# Patient Record
Sex: Female | Born: 1950 | Race: White | Hispanic: No | Marital: Married | Smoking: Current every day smoker
Health system: Southern US, Community
[De-identification: ages and names within clinical notes are randomized; demographics above are authoritative.]

## PROBLEM LIST (undated history)

## (undated) DIAGNOSIS — IMO0001 Reserved for inherently not codable concepts without codable children: Secondary | ICD-10-CM

## (undated) DIAGNOSIS — R609 Edema, unspecified: Secondary | ICD-10-CM

## (undated) DIAGNOSIS — M1990 Unspecified osteoarthritis, unspecified site: Secondary | ICD-10-CM

## (undated) DIAGNOSIS — K219 Gastro-esophageal reflux disease without esophagitis: Secondary | ICD-10-CM

## (undated) DIAGNOSIS — L409 Psoriasis, unspecified: Secondary | ICD-10-CM

## (undated) HISTORY — PX: OTHER SURGICAL HISTORY: SHX169

## (undated) HISTORY — PX: APPENDECTOMY: SHX54

## (undated) HISTORY — PX: ABDOMINAL HYSTERECTOMY: SHX81

## (undated) HISTORY — PX: SHOULDER SURGERY: SHX246

## (undated) HISTORY — PX: KNEE SURGERY: SHX244

---

## 2001-09-16 ENCOUNTER — Other Ambulatory Visit: Admission: RE | Admit: 2001-09-16 | Discharge: 2001-09-16 | Payer: Self-pay | Admitting: Family Medicine

## 2002-10-08 ENCOUNTER — Other Ambulatory Visit: Admission: RE | Admit: 2002-10-08 | Discharge: 2002-10-08 | Payer: Self-pay | Admitting: Family Medicine

## 2003-10-14 ENCOUNTER — Other Ambulatory Visit: Admission: RE | Admit: 2003-10-14 | Discharge: 2003-10-14 | Payer: Self-pay | Admitting: Family Medicine

## 2003-12-02 ENCOUNTER — Ambulatory Visit (HOSPITAL_COMMUNITY): Admission: RE | Admit: 2003-12-02 | Discharge: 2003-12-02 | Payer: Self-pay | Admitting: Gastroenterology

## 2004-02-24 ENCOUNTER — Emergency Department (HOSPITAL_COMMUNITY): Admission: EM | Admit: 2004-02-24 | Discharge: 2004-02-24 | Payer: Self-pay | Admitting: Emergency Medicine

## 2004-05-07 ENCOUNTER — Emergency Department (HOSPITAL_COMMUNITY): Admission: EM | Admit: 2004-05-07 | Discharge: 2004-05-07 | Payer: Self-pay | Admitting: Emergency Medicine

## 2004-07-18 ENCOUNTER — Emergency Department (HOSPITAL_COMMUNITY): Admission: EM | Admit: 2004-07-18 | Discharge: 2004-07-19 | Payer: Self-pay | Admitting: Emergency Medicine

## 2004-07-19 ENCOUNTER — Emergency Department (HOSPITAL_COMMUNITY): Admission: EM | Admit: 2004-07-19 | Discharge: 2004-07-19 | Payer: Self-pay | Admitting: Emergency Medicine

## 2009-07-15 ENCOUNTER — Emergency Department (HOSPITAL_COMMUNITY): Admission: EM | Admit: 2009-07-15 | Discharge: 2009-07-15 | Payer: Self-pay | Admitting: Emergency Medicine

## 2011-03-08 ENCOUNTER — Emergency Department (HOSPITAL_COMMUNITY)
Admission: EM | Admit: 2011-03-08 | Discharge: 2011-03-09 | Disposition: A | Discharge status: Home / Self Care | Payer: Federal, State, Local not specified - PPO | Attending: Emergency Medicine | Admitting: Emergency Medicine

## 2011-03-08 DIAGNOSIS — T6391XA Toxic effect of contact with unspecified venomous animal, accidental (unintentional), initial encounter: Secondary | ICD-10-CM | POA: Insufficient documentation

## 2011-03-08 DIAGNOSIS — K219 Gastro-esophageal reflux disease without esophagitis: Secondary | ICD-10-CM | POA: Insufficient documentation

## 2011-03-08 DIAGNOSIS — T63461A Toxic effect of venom of wasps, accidental (unintentional), initial encounter: Secondary | ICD-10-CM | POA: Insufficient documentation

## 2011-05-04 NOTE — Op Note (Signed)
NAME:  Tricia Baker, Tricia Baker                       ACCOUNT NO.:  [id number]   MEDICAL RECORD NO.:  [id number]                   PATIENT TYPE:  AMB   LOCATION:  ENDO                                 FACILITY:  [facility]   PHYSICIAN:  John C. Madilyn Fireman, M.D.                 DATE OF BIRTH:  06-Nov-1951   DATE OF PROCEDURE:  12/02/2003  DATE OF DISCHARGE:                                 OPERATIVE REPORT   PROCEDURE:  Colonoscopy.   INDICATION FOR PROCEDURE:  Colon cancer screening in a 59 year old patient  with no previous screening.   DESCRIPTION OF PROCEDURE:  The patient was placed in the left lateral  decubitus position and placed on the pulse monitor with continuous low-flow  oxygen delivered by nasal cannula.  She was sedated with 75 mcg IV fentanyl  and 7 mg IV Versed.  The Olympus video colonoscope was inserted into the  rectum and advanced to the cecum, confirmed by transillumination at  McBurney's point and visualization of the ileocecal valve and appendiceal  orifice.  The prep was excellent.  The cecum, ascending, transverse,  descending, and sigmoid colon all appeared normal with no masses, polyps,  diverticula, or other mucosal abnormalities.  The rectum likewise appeared  normal, and retroflexed view of the anus revealed no obvious internal  hemorrhoids.  The scope was then withdrawn and the patient returned to the  recovery room in stable condition.  She tolerated the procedure well, and  there were no immediate complications.   IMPRESSION:  Normal colonoscopy.   PLAN:  Next screening by sigmoidoscopy in five years.                                               John C. Madilyn Fireman, M.D.    [facility]/MEDQ  D:  12/02/2003  T:  12/02/2003  Job:  [id number]   cc:   Magnus Sinning. Dimple Casey, M.D.  [address] [place]  [place] [address]  Fax: [phone]

## 2012-07-06 ENCOUNTER — Emergency Department (HOSPITAL_COMMUNITY)
Admission: EM | Admit: 2012-07-06 | Discharge: 2012-07-06 | Disposition: A | Discharge status: Home / Self Care | Payer: Federal, State, Local not specified - PPO | Attending: Emergency Medicine | Admitting: Emergency Medicine

## 2012-07-06 ENCOUNTER — Encounter (HOSPITAL_COMMUNITY): Payer: Self-pay | Admitting: *Deleted

## 2012-07-06 DIAGNOSIS — T63441A Toxic effect of venom of bees, accidental (unintentional), initial encounter: Secondary | ICD-10-CM

## 2012-07-06 DIAGNOSIS — T6391XA Toxic effect of contact with unspecified venomous animal, accidental (unintentional), initial encounter: Secondary | ICD-10-CM | POA: Insufficient documentation

## 2012-07-06 DIAGNOSIS — Z9089 Acquired absence of other organs: Secondary | ICD-10-CM | POA: Insufficient documentation

## 2012-07-06 DIAGNOSIS — Z880 Allergy status to penicillin: Secondary | ICD-10-CM | POA: Insufficient documentation

## 2012-07-06 DIAGNOSIS — T63461A Toxic effect of venom of wasps, accidental (unintentional), initial encounter: Secondary | ICD-10-CM | POA: Insufficient documentation

## 2012-07-06 DIAGNOSIS — F17200 Nicotine dependence, unspecified, uncomplicated: Secondary | ICD-10-CM | POA: Insufficient documentation

## 2012-07-06 DIAGNOSIS — Z90710 Acquired absence of both cervix and uterus: Secondary | ICD-10-CM | POA: Insufficient documentation

## 2012-07-06 DIAGNOSIS — Z882 Allergy status to sulfonamides status: Secondary | ICD-10-CM | POA: Insufficient documentation

## 2012-07-06 DIAGNOSIS — K219 Gastro-esophageal reflux disease without esophagitis: Secondary | ICD-10-CM | POA: Insufficient documentation

## 2012-07-06 HISTORY — DX: Psoriasis, unspecified: L40.9

## 2012-07-06 HISTORY — DX: Gastro-esophageal reflux disease without esophagitis: K21.9

## 2012-07-06 HISTORY — DX: Reserved for inherently not codable concepts without codable children: IMO0001

## 2012-07-06 NOTE — ED Notes (Signed)
Patient ambulatory to restroom  ?

## 2012-07-06 NOTE — ED Notes (Signed)
Dr Rosalia Hammers with pt at this time

## 2012-07-06 NOTE — ED Notes (Signed)
Stung by a bee and had allergic reaction at home. Pt took Pepcid, Benadryl, and used EpiPen. Was given SoluMedrol 125mg  IV, Benadryl 50mg  IV, and Epi 0.3mg  IM. Pt states she initially had trouble breathing. Pt states her throat still feels swollen. NAD.

## 2012-07-06 NOTE — ED Provider Notes (Signed)
History     CSN: [id number]  Arrival date & time 07/06/12  1351   First MD Initiated Contact with Patient 07/06/12 1455      Chief Complaint  Patient presents with  . Allergic Reaction    (Consider location/radiation/quality/duration/timing/severity/associated sxs/prior treatment) HPI  Patient with bee sting to left great to at about 1230.  Patient took benadryl and pepcid,then 10 minutes later throat felt like it was swelling and patient use epipen.  Husband started to bring to hospital but she seemed to be having trouble breathing and shaking and he stopped and called ems.  Patient given solumedrol, benadryl, and epi.  Patient here for one hour now and states feels like throat still full.  She has had multiple bee stings in past with similar allergic reaction.  No history of hospitalization or intubation.  Past Medical History  Diagnosis Date  . Psoriasis   . Reflux     Past Surgical History  Procedure Date  . Appendectomy   . Abdominal hysterectomy   . Vocal cord surgery     No family history on file.  History  Substance Use Topics  . Smoking status: Current Everyday Smoker  . Smokeless tobacco: Not on file  . Alcohol Use: No    OB History    Grav Para Term Preterm Abortions TAB SAB Ect Mult Living                  Review of Systems  All other systems reviewed and are negative.    Allergies  Bee venom; Penicillins; and Sulfa antibiotics  Home Medications   Current Outpatient Rx  Name Route Sig Dispense Refill  . ALBUTEROL SULFATE HFA 108 (90 BASE) MCG/ACT IN AERS Inhalation Inhale 2 puffs into the lungs every 6 (six) hours as needed. For shortness of breath due to allergic reactions    . CALCIUM CARBONATE-VITAMIN D 500-200 MG-UNIT PO TABS Oral Take 1 tablet by mouth at bedtime.    Marland Kitchen CLOBETASOL PROPIONATE 0.05 % EX OINT Topical Apply 1 application topically 2 (two) times daily.    Marland Kitchen DIPHENHYDRAMINE HCL 25 MG PO TABS Oral Take 75 mg by mouth every 6  (six) hours as needed. For allergic reactions and seasonal allergies    . EPINEPHRINE 0.3 MG/0.3ML IJ DEVI Intramuscular Inject 0.3 mg into the muscle once.    . ESOMEPRAZOLE MAGNESIUM 40 MG PO CPDR Oral Take 40 mg by mouth daily before breakfast.    . FAMOTIDINE 40 MG PO TABS Oral Take 40 mg by mouth daily.    Marland Kitchen HYDROCHLOROTHIAZIDE 25 MG PO TABS Oral Take 25 mg by mouth every morning.    Marland Kitchen PRESCRIPTION MEDICATION  For arthritis pain    . USTEKINUMAB 90 MG/ML [place] SOLN Subcutaneous Inject 90 mg into the skin every 3 (three) months.    Marland Kitchen VITAMIN D (ERGOCALCIFEROL) 50000 UNITS PO CAPS Oral Take 50,000 Units by mouth every 7 (seven) days. On Wednesdays      BP 118/59  Pulse 77  Temp 98.2 F (36.8 C) (Oral)  Resp 14  Ht 5\' 6"  (1.676 m)  Wt 176 lb (79.833 kg)  BMI 28.41 kg/m2  SpO2 95%  Physical Exam  Nursing note and vitals reviewed. Constitutional: She appears well-developed and well-nourished.  HENT:  Head: Normocephalic and atraumatic.  Eyes: Conjunctivae and EOM are normal. Pupils are equal, round, and reactive to light.  Neck: Normal range of motion. Neck supple.  Cardiovascular: Normal rate, regular rhythm, normal heart sounds  and intact distal pulses.   Pulmonary/Chest: Effort normal and breath sounds normal.  Abdominal: Soft. Bowel sounds are normal.  Musculoskeletal: Normal range of motion.  Neurological: She is alert.  Skin: Skin is warm and dry.  Psychiatric: She has a normal mood and affect. Thought content normal.    ED Course  Procedures (including critical care time)  Labs Reviewed - No data to display No results found.   No diagnosis found.    MDM  Patient with ongoing evaluation for six hours post sting.  No evidence of rebound from epi.  Patient continues with throat better, lungs clear, bp stable and no rash.  Patient advised return if any return of symptoms.        Hilario Quarry, MD 07/09/12 [phone]

## 2013-05-19 ENCOUNTER — Encounter (HOSPITAL_COMMUNITY): Payer: Self-pay | Admitting: *Deleted

## 2013-05-19 ENCOUNTER — Emergency Department (HOSPITAL_COMMUNITY)
Admission: EM | Admit: 2013-05-19 | Discharge: 2013-05-19 | Disposition: A | Discharge status: Home / Self Care | Payer: Federal, State, Local not specified - PPO | Attending: Emergency Medicine | Admitting: Emergency Medicine

## 2013-05-19 DIAGNOSIS — T7840XA Allergy, unspecified, initial encounter: Secondary | ICD-10-CM

## 2013-05-19 DIAGNOSIS — K219 Gastro-esophageal reflux disease without esophagitis: Secondary | ICD-10-CM | POA: Insufficient documentation

## 2013-05-19 DIAGNOSIS — F17200 Nicotine dependence, unspecified, uncomplicated: Secondary | ICD-10-CM | POA: Insufficient documentation

## 2013-05-19 DIAGNOSIS — Z880 Allergy status to penicillin: Secondary | ICD-10-CM | POA: Insufficient documentation

## 2013-05-19 DIAGNOSIS — L408 Other psoriasis: Secondary | ICD-10-CM | POA: Insufficient documentation

## 2013-05-19 DIAGNOSIS — R21 Rash and other nonspecific skin eruption: Secondary | ICD-10-CM | POA: Insufficient documentation

## 2013-05-19 DIAGNOSIS — Y9389 Activity, other specified: Secondary | ICD-10-CM | POA: Insufficient documentation

## 2013-05-19 DIAGNOSIS — T63461A Toxic effect of venom of wasps, accidental (unintentional), initial encounter: Secondary | ICD-10-CM | POA: Insufficient documentation

## 2013-05-19 DIAGNOSIS — Z79899 Other long term (current) drug therapy: Secondary | ICD-10-CM | POA: Insufficient documentation

## 2013-05-19 DIAGNOSIS — Y929 Unspecified place or not applicable: Secondary | ICD-10-CM | POA: Insufficient documentation

## 2013-05-19 MED ORDER — METHYLPREDNISOLONE SODIUM SUCC 125 MG IJ SOLR
125.0000 mg | Freq: Once | INTRAMUSCULAR | Status: AC
  Administered 2013-05-19: 125 mg via INTRAVENOUS
  Filled 2013-05-19: qty 2

## 2013-05-19 MED ORDER — LORAZEPAM 2 MG/ML IJ SOLN
0.5000 mg | Freq: Once | INTRAMUSCULAR | Status: AC
  Administered 2013-05-19: 0.5 mg via INTRAVENOUS
  Filled 2013-05-19: qty 1

## 2013-05-19 MED ORDER — DIPHENHYDRAMINE HCL 50 MG/ML IJ SOLN: 25.0000 mg | Freq: Once | INTRAMUSCULAR | Status: DC

## 2013-05-19 NOTE — ED Notes (Signed)
Pt states she was stung or bit by something to left upper arm. States the area is sore to touch. Pt has no respiratory difficulty

## 2013-05-19 NOTE — ED Provider Notes (Signed)
History     CSN: [id number]  Arrival date & time 05/19/13  1804   First MD Initiated Contact with Patient 05/19/13 1821      Chief Complaint  Patient presents with  . Allergic Reaction    (Consider location/radiation/quality/duration/timing/severity/associated sxs/prior treatment) Patient is a 62 y.o. female presenting with allergic reaction. The history is provided by the patient (pt was stung by a bee in the left upper arm.  she has hx of allergic reaction.  pt took epi shot today).  Allergic Reaction Presenting symptoms: rash   Presenting symptoms: no difficulty breathing and no difficulty swallowing   Severity:  Moderate Prior allergic episodes:  Insect allergies Context: no animal exposure   Relieved by:  Nothing Worsened by:  Nothing tried Ineffective treatments:  Epinephrine   Past Medical History  Diagnosis Date  . Psoriasis   . Reflux     Past Surgical History  Procedure Laterality Date  . Appendectomy    . Abdominal hysterectomy    . Vocal cord surgery      No family history on file.  History  Substance Use Topics  . Smoking status: Current Every Day Smoker    Types: Cigarettes  . Smokeless tobacco: Not on file  . Alcohol Use: No     Comment: occasional    OB History   Grav Para Term Preterm Abortions TAB SAB Ect Mult Living                  Review of Systems  Constitutional: Negative for appetite change and fatigue.  HENT: Negative for congestion, trouble swallowing, sinus pressure and ear discharge.   Eyes: Negative for discharge.  Respiratory: Negative for cough.   Cardiovascular: Negative for chest pain.  Gastrointestinal: Negative for abdominal pain and diarrhea.  Genitourinary: Negative for frequency and hematuria.  Musculoskeletal: Negative for back pain.  Skin: Positive for rash.  Neurological: Negative for seizures and headaches.  Psychiatric/Behavioral: Negative for hallucinations.    Allergies  Bee venom; Penicillins; and  Sulfa antibiotics  Home Medications   Current Outpatient Rx  Name  Route  Sig  Dispense  Refill  . albuterol (PROVENTIL HFA;VENTOLIN HFA) 108 (90 BASE) MCG/ACT inhaler   Inhalation   Inhale 2 puffs into the lungs every 6 (six) hours as needed. For shortness of breath due to allergic reactions         . calcium-vitamin D (OSCAL WITH D) 500-200 MG-UNIT per tablet   Oral   Take 1 tablet by mouth at bedtime.         . clobetasol ointment (TEMOVATE) 0.05 %   Topical   Apply 1 application topically 2 (two) times daily.         . diphenhydrAMINE (BENADRYL) 25 MG tablet   Oral   Take 75 mg by mouth every 6 (six) hours as needed. For allergic reactions and seasonal allergies         . diphenhydrAMINE (SOMINEX) 25 MG tablet   Oral   Take 100 mg by mouth once as needed for itching, allergies or sleep.         Marland Kitchen EPINEPHrine (EPI-PEN) 0.3 mg/0.3 mL DEVI   Intramuscular   Inject 0.3 mg into the muscle once.         Marland Kitchen esomeprazole (NEXIUM) 40 MG capsule   Oral   Take 40 mg by mouth daily before breakfast.         . famotidine (PEPCID) 40 MG tablet   Oral  Take 40 mg by mouth daily.         . hydrochlorothiazide (HYDRODIURIL) 25 MG tablet   Oral   Take 25 mg by mouth every morning.         . meloxicam (MOBIC) 15 MG tablet   Oral   Take 15 mg by mouth daily.         Marland Kitchen Ustekinumab (STELARA) 90 MG/ML SOLN   Subcutaneous   Inject 90 mg into the skin every 3 (three) months.           BP 134/79  Pulse 74  Resp 20  SpO2 98%  Physical Exam  Constitutional: She is oriented to person, place, and time. She appears well-developed.  HENT:  Head: Normocephalic.  Eyes: Conjunctivae and EOM are normal. No scleral icterus.  Neck: Neck supple. No thyromegaly present.  Cardiovascular: Normal rate and regular rhythm.  Exam reveals no gallop and no friction rub.   No murmur heard. Pulmonary/Chest: No stridor. She has no wheezes. She has no rales. She exhibits no  tenderness.  Abdominal: She exhibits no distension. There is no tenderness. There is no rebound.  Musculoskeletal: She exhibits no edema.  Rash and swelling left upper arm  Lymphadenopathy:    She has no cervical adenopathy.  Neurological: She is oriented to person, place, and time. Coordination normal.  Skin: No rash noted. No erythema.  Psychiatric: She has a normal mood and affect. Her behavior is normal.    ED Course  Procedures (including critical care time)  Labs Reviewed - No data to display No results found.   1. Allergic reaction, initial encounter       MDM          Benny Lennert, MD 05/19/13 [phone]

## 2013-05-19 NOTE — ED Notes (Signed)
Discharge instructions reviewed with pt, questions answered. Pt verbalized understanding.  

## 2013-05-19 NOTE — ED Notes (Signed)
?   Bee sting to left upper arm approx 1 hr PTA.  Pt did not remove stinger, unsure if one was present.  C/o pain to left arm.  States used Epi Pen, 4 OTC Benadryl 100mg , Pepid 120 mg PTA.  Pt states is having trouble swallowing at this time.  Denies difficulty breathing.  C/o swelling to left arm and throat.

## 2013-06-25 ENCOUNTER — Emergency Department (HOSPITAL_COMMUNITY)
Admission: EM | Admit: 2013-06-25 | Discharge: 2013-06-25 | Disposition: A | Discharge status: Home / Self Care | Payer: Federal, State, Local not specified - PPO | Attending: Emergency Medicine | Admitting: Emergency Medicine

## 2013-06-25 ENCOUNTER — Encounter (HOSPITAL_COMMUNITY): Payer: Self-pay | Admitting: *Deleted

## 2013-06-25 DIAGNOSIS — R1310 Dysphagia, unspecified: Secondary | ICD-10-CM | POA: Insufficient documentation

## 2013-06-25 DIAGNOSIS — Y929 Unspecified place or not applicable: Secondary | ICD-10-CM | POA: Insufficient documentation

## 2013-06-25 DIAGNOSIS — Y939 Activity, unspecified: Secondary | ICD-10-CM | POA: Insufficient documentation

## 2013-06-25 DIAGNOSIS — F17200 Nicotine dependence, unspecified, uncomplicated: Secondary | ICD-10-CM | POA: Insufficient documentation

## 2013-06-25 DIAGNOSIS — R51 Headache: Secondary | ICD-10-CM | POA: Insufficient documentation

## 2013-06-25 DIAGNOSIS — Z79899 Other long term (current) drug therapy: Secondary | ICD-10-CM | POA: Insufficient documentation

## 2013-06-25 DIAGNOSIS — Z8639 Personal history of other endocrine, nutritional and metabolic disease: Secondary | ICD-10-CM | POA: Insufficient documentation

## 2013-06-25 DIAGNOSIS — Z880 Allergy status to penicillin: Secondary | ICD-10-CM | POA: Insufficient documentation

## 2013-06-25 DIAGNOSIS — Z791 Long term (current) use of non-steroidal anti-inflammatories (NSAID): Secondary | ICD-10-CM | POA: Insufficient documentation

## 2013-06-25 DIAGNOSIS — M129 Arthropathy, unspecified: Secondary | ICD-10-CM | POA: Insufficient documentation

## 2013-06-25 DIAGNOSIS — Z872 Personal history of diseases of the skin and subcutaneous tissue: Secondary | ICD-10-CM | POA: Insufficient documentation

## 2013-06-25 DIAGNOSIS — Z862 Personal history of diseases of the blood and blood-forming organs and certain disorders involving the immune mechanism: Secondary | ICD-10-CM | POA: Insufficient documentation

## 2013-06-25 DIAGNOSIS — K219 Gastro-esophageal reflux disease without esophagitis: Secondary | ICD-10-CM | POA: Insufficient documentation

## 2013-06-25 DIAGNOSIS — IMO0001 Reserved for inherently not codable concepts without codable children: Secondary | ICD-10-CM | POA: Insufficient documentation

## 2013-06-25 HISTORY — DX: Unspecified osteoarthritis, unspecified site: M19.90

## 2013-06-25 HISTORY — DX: Edema, unspecified: R60.9

## 2013-06-25 MED ORDER — METHYLPREDNISOLONE SODIUM SUCC 125 MG IJ SOLR
125.0000 mg | Freq: Once | INTRAMUSCULAR | Status: AC
  Administered 2013-06-25: 125 mg via INTRAVENOUS
  Filled 2013-06-25: qty 2

## 2013-06-25 MED ORDER — HYDROCODONE-ACETAMINOPHEN 5-325 MG PO TABS
1.0000 | ORAL_TABLET | Freq: Once | ORAL | Status: AC
  Administered 2013-06-25: 1 via ORAL
  Filled 2013-06-25: qty 1

## 2013-06-25 MED ORDER — PREDNISONE 10 MG PO TABS: 40.0000 mg | ORAL_TABLET | Freq: Every day | ORAL | Status: DC

## 2013-06-25 NOTE — ED Provider Notes (Signed)
History  This chart was scribed for Tricia Jakes, MD by Bennett Scrape, ED Scribe. This patient was seen in room APA10/APA10 and the patient's care was started at 11:52 AM.  CSN: [id number]  Arrival date & time 06/25/13  1100   First MD Initiated Contact with Patient 06/25/13 1152     Chief Complaint  Patient presents with  . Allergic Reaction    Patient is a 62 y.o. female presenting with allergic reaction. The history is provided by the patient. No language interpreter was used.  Allergic Reaction Presenting symptoms: difficulty swallowing and swelling   Presenting symptoms: no rash   Severity:  Moderate Prior allergic episodes:  Insect allergies Context: insect bite/sting   Relieved by:  Antihistamines and epinephrine Worsened by:  Nothing tried   HPI Comments: Tricia Baker is a 61 y.o. female brought in by ambulance, who presents to the Emergency Department complaining of an allergic reaction described as difficulty swallowing, difficulty breathing and tongue swelling to 6 to 8 bee stings today. She states that she stepped on a hornet's nest while watering her garden and reports stings to her hands, arms, legs and buttocks. 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 She reports that she takes 40 mg Pepcid and benadryl daily as a preventive measure during the spring and summer months. EMS administered 50 mg benadryl IV en route. She c/o associated pain at the sting sites and states that the swelling and difficulties have improved. She denies any facial swelling, CP and SOB as associated symptoms.  PCP is Dr. Ashley Royalty with [facility]   Past Medical History  Diagnosis Date  . Psoriasis   . Reflux   . Arthritis   . Fluid retention    Past Surgical History  Procedure Laterality Date  . Appendectomy    . Abdominal hysterectomy    . Vocal cord surgery      No family history on file. History  Substance Use Topics  . Smoking status: Current Every Day Smoker    Types: Cigarettes  . Smokeless tobacco: Not on file  . Alcohol Use: No     Comment: occasional   No OB history provided.  Review of Systems  Constitutional: Negative for fever and chills.  HENT: Positive for trouble swallowing. Negative for congestion, sore throat and neck pain.   Eyes: Negative for visual disturbance.  Respiratory: Negative for cough and shortness of breath.   Cardiovascular: Negative for chest pain and leg swelling.  Gastrointestinal: Negative for nausea, vomiting, abdominal pain and diarrhea.  Genitourinary: Negative for dysuria.  Musculoskeletal: Negative for back pain.  Skin: Negative for rash.  Neurological: Positive for headaches (from epi pen).  Hematological: Does not bruise/bleed easily.  Psychiatric/Behavioral: Negative for confusion.    Allergies  Bee venom; Penicillins; and Sulfa antibiotics  Home Medications   Current Outpatient Rx  Name  Route  Sig  Dispense  Refill  . albuterol (PROVENTIL HFA;VENTOLIN HFA) 108 (90 BASE) MCG/ACT inhaler   Inhalation   Inhale 2 puffs into the lungs every 6 (six) hours as needed. For shortness of breath due to allergic reactions         . calcium-vitamin D (OSCAL WITH D) 500-200 MG-UNIT per tablet   Oral   Take 1 tablet by mouth at bedtime.         . clobetasol ointment (TEMOVATE)  0.05 %   Topical   Apply 1 application topically 2 (two) times daily.         . diphenhydrAMINE (BENADRYL) 25 MG tablet   Oral   Take 75 mg by mouth every 6 (six) hours as needed. For allergic reactions and seasonal allergies         . EPINEPHrine (EPI-PEN) 0.3 mg/0.3 mL DEVI   Intramuscular   Inject 0.3 mg into the muscle once.         Marland Kitchen esomeprazole (NEXIUM) 40 MG capsule   Oral   Take 40 mg by mouth daily before breakfast.         . famotidine (PEPCID) 40 MG tablet   Oral   Take 40 mg by mouth  daily.         . hydrochlorothiazide (HYDRODIURIL) 25 MG tablet   Oral   Take 25 mg by mouth every morning.         Marland Kitchen levofloxacin (LEVAQUIN) 500 MG tablet   Oral   Take 500 mg by mouth daily.         . meloxicam (MOBIC) 15 MG tablet   Oral   Take 15 mg by mouth daily.         Marland Kitchen PRESCRIPTION MEDICATION   Intramuscular   Inject 1 Units into the muscle once. Steroid injection on 06/22/2013         . Ustekinumab (STELARA) 90 MG/ML SOLN   Subcutaneous   Inject 90 mg into the skin every 3 (three) months.         . predniSONE (DELTASONE) 10 MG tablet   Oral   Take 4 tablets (40 mg total) by mouth daily.   20 tablet   0    Triage Vitals: BP 123/60  Pulse 78  Temp(Src) 97.8 F (36.6 C) (Oral)  Resp 12  SpO2 97%  Physical Exam  Nursing note and vitals reviewed. Constitutional: She is oriented to person, place, and time. She appears well-developed and well-nourished. No distress.  HENT:  Head: Normocephalic and atraumatic.  Mouth/Throat: Oropharynx is clear and moist.  No tongue or oropharynx tongue  Eyes: Conjunctivae and EOM are normal. Pupils are equal, round, and reactive to light.  Sclera are clear  Neck: Neck supple. No tracheal deviation present.  Cardiovascular: Normal rate and regular rhythm.   No murmur heard. Pulses:      Dorsalis pedis pulses are 2+ on the right side, and 2+ on the left side.  Pulmonary/Chest: Effort normal and breath sounds normal. No respiratory distress. She has no wheezes.  Abdominal: Soft. Bowel sounds are normal. She exhibits no distension. There is no tenderness.  Musculoskeletal: Normal range of motion. She exhibits no edema (no ankle swelling).  Lymphadenopathy:    She has no cervical adenopathy.  Neurological: She is alert and oriented to person, place, and time. No cranial nerve deficit.  Pt able to move both sets of fingers and toes  Skin: Skin is warm and dry.  6 to 8 scattered bee stings with erythema throughout   extremities   Psychiatric: She has a normal mood and affect. Her behavior is normal.    ED Course  Procedures (including critical care time)  DIAGNOSTIC STUDIES: Oxygen Saturation is 97% on room air, normal by my interpretation.    COORDINATION OF CARE: 12:51 PM-Discussed discharge plan which includes continued benadryl and pepcid use with pt at bedside and pt agreed to plan. Advised pt that the localized reactions will take several  days to improve.  Labs Reviewed - No data to display No results found.  1. 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 Is premedicated all the time with Pepcid and Benadryl and has EpiPen's at home. Stung by yellow jackets approximately 6-8 stings today use her EpiPen. EMS also gave her additional Benadryl to not provide of steroid in route. Patient was improving sniffing that time the ear she did get throat tightness to have some tongue swelling. Patient given IV Cipro Medrol here and pain medication doing much better. The bee stings are consistent with local reaction no wheezing no tongue swelling no lip swelling no tight Nissen the throat anymore. Patient is followed by an allergist. Patient is stable for discharge home.  I personally performed the services described in this documentation, which was scribed in my presence. The recorded information has been reviewed and is accurate.     Tricia Jakes, MD 06/25/13 [phone]

## 2013-06-25 NOTE — ED Notes (Signed)
Pt c/o burning to sting sights.  Ice packs applied for comfort.  nad noted.

## 2013-06-25 NOTE — ED Notes (Signed)
Multiple bee stings today while in garden.  Pt used Epi Pen along with Pepcid 40mg  PO, and Benadryl 50mg  PO PTA.  EMS gave Benadryl 50mg  IV en route.  Reports approx 5-10 stings, c/o pain at sting sights.  Pt also reporting difficulty swallowing and tongue swelling.  Airway open and clear, moderate swelling to tongue noted.  Lung sounds clear.  NSR on monitor.

## 2014-02-02 ENCOUNTER — Encounter (HOSPITAL_COMMUNITY): Payer: Self-pay | Admitting: Emergency Medicine

## 2014-02-02 ENCOUNTER — Emergency Department (HOSPITAL_COMMUNITY)
Admission: EM | Admit: 2014-02-02 | Discharge: 2014-02-02 | Disposition: A | Discharge status: Home / Self Care | Payer: Federal, State, Local not specified - PPO | Attending: Emergency Medicine | Admitting: Emergency Medicine

## 2014-02-02 DIAGNOSIS — J111 Influenza due to unidentified influenza virus with other respiratory manifestations: Secondary | ICD-10-CM | POA: Insufficient documentation

## 2014-02-02 DIAGNOSIS — Z880 Allergy status to penicillin: Secondary | ICD-10-CM | POA: Insufficient documentation

## 2014-02-02 DIAGNOSIS — R6889 Other general symptoms and signs: Secondary | ICD-10-CM

## 2014-02-02 DIAGNOSIS — K219 Gastro-esophageal reflux disease without esophagitis: Secondary | ICD-10-CM | POA: Insufficient documentation

## 2014-02-02 DIAGNOSIS — Z872 Personal history of diseases of the skin and subcutaneous tissue: Secondary | ICD-10-CM | POA: Insufficient documentation

## 2014-02-02 DIAGNOSIS — Z9089 Acquired absence of other organs: Secondary | ICD-10-CM | POA: Insufficient documentation

## 2014-02-02 DIAGNOSIS — Z8739 Personal history of other diseases of the musculoskeletal system and connective tissue: Secondary | ICD-10-CM | POA: Insufficient documentation

## 2014-02-02 DIAGNOSIS — K5289 Other specified noninfective gastroenteritis and colitis: Secondary | ICD-10-CM | POA: Insufficient documentation

## 2014-02-02 DIAGNOSIS — K529 Noninfective gastroenteritis and colitis, unspecified: Secondary | ICD-10-CM

## 2014-02-02 DIAGNOSIS — Z791 Long term (current) use of non-steroidal anti-inflammatories (NSAID): Secondary | ICD-10-CM | POA: Insufficient documentation

## 2014-02-02 DIAGNOSIS — Z90710 Acquired absence of both cervix and uterus: Secondary | ICD-10-CM | POA: Insufficient documentation

## 2014-02-02 DIAGNOSIS — F17200 Nicotine dependence, unspecified, uncomplicated: Secondary | ICD-10-CM | POA: Insufficient documentation

## 2014-02-02 DIAGNOSIS — Z79899 Other long term (current) drug therapy: Secondary | ICD-10-CM | POA: Insufficient documentation

## 2014-02-02 LAB — COMPREHENSIVE METABOLIC PANEL
ALT: 27 U/L (ref 0–35)
AST: 23 U/L (ref 0–37)
Albumin: 4.4 g/dL (ref 3.5–5.2)
Alkaline Phosphatase: 85 U/L (ref 39–117)
BUN: 16 mg/dL (ref 6–23)
CALCIUM: 9.8 mg/dL (ref 8.4–10.5)
CO2: 26 mEq/L (ref 19–32)
Chloride: 98 mEq/L (ref 96–112)
Creatinine, Ser: 0.76 mg/dL (ref 0.50–1.10)
GFR calc non Af Amer: 89 mL/min — ABNORMAL LOW (ref >90)
GLUCOSE: 115 mg/dL — AB (ref 70–99)
POTASSIUM: 4.3 meq/L (ref 3.7–5.3)
Sodium: 139 mEq/L (ref 137–147)
TOTAL PROTEIN: 8.6 g/dL — AB (ref 6.0–8.3)
Total Bilirubin: 0.7 mg/dL (ref 0.3–1.2)

## 2014-02-02 LAB — CBC WITH DIFFERENTIAL/PLATELET
BASOS ABS: 0 10*3/uL (ref 0.0–0.1)
Basophils Relative: 0 % (ref 0–1)
EOS ABS: 0.1 10*3/uL (ref 0.0–0.7)
EOS PCT: 0 % (ref 0–5)
HCT: 46.5 % — ABNORMAL HIGH (ref 36.0–46.0)
Hemoglobin: 15.8 g/dL — ABNORMAL HIGH (ref 12.0–15.0)
LYMPHS PCT: 5 % — AB (ref 12–46)
Lymphs Abs: 0.6 10*3/uL — ABNORMAL LOW (ref 0.7–4.0)
MCH: 30.6 pg (ref 26.0–34.0)
MCHC: 34 g/dL (ref 30.0–36.0)
MCV: 90.1 fL (ref 78.0–100.0)
MONOS PCT: 4 % (ref 3–12)
Monocytes Absolute: 0.6 10*3/uL (ref 0.1–1.0)
Neutro Abs: 12.4 10*3/uL — ABNORMAL HIGH (ref 1.7–7.7)
Neutrophils Relative %: 91 % — ABNORMAL HIGH (ref 43–77)
Platelets: 250 10*3/uL (ref 150–400)
RBC: 5.16 MIL/uL — AB (ref 3.87–5.11)
RDW: 15.2 % (ref 11.5–15.5)
WBC: 13.7 10*3/uL — AB (ref 4.0–10.5)

## 2014-02-02 LAB — INFLUENZA PANEL BY PCR (TYPE A & B)
H1N1FLUPCR: NOT DETECTED
Influenza A By PCR: NEGATIVE
Influenza B By PCR: NEGATIVE

## 2014-02-02 MED ORDER — KETOROLAC TROMETHAMINE 30 MG/ML IJ SOLN
30.0000 mg | Freq: Once | INTRAMUSCULAR | Status: AC
  Administered 2014-02-02: 30 mg via INTRAVENOUS
  Filled 2014-02-02: qty 1

## 2014-02-02 MED ORDER — ONDANSETRON HCL 4 MG/2ML IJ SOLN
4.0000 mg | Freq: Once | INTRAMUSCULAR | Status: DC
  Filled 2014-02-02: qty 2

## 2014-02-02 MED ORDER — ONDANSETRON HCL 4 MG/2ML IJ SOLN
4.0000 mg | Freq: Once | INTRAMUSCULAR | Status: AC
  Administered 2014-02-02: 4 mg via INTRAVENOUS

## 2014-02-02 MED ORDER — LOPERAMIDE HCL 2 MG PO CAPS
4.0000 mg | ORAL_CAPSULE | Freq: Once | ORAL | Status: AC
  Administered 2014-02-02: 4 mg via ORAL
  Filled 2014-02-02: qty 2

## 2014-02-02 MED ORDER — ONDANSETRON HCL 8 MG PO TABS: 8.0000 mg | ORAL_TABLET | Freq: Three times a day (TID) | ORAL | Status: DC | PRN

## 2014-02-02 MED ORDER — SODIUM CHLORIDE 0.9 % IV BOLUS (SEPSIS)
1000.0000 mL | Freq: Once | INTRAVENOUS | Status: AC
  Administered 2014-02-02: 1000 mL via INTRAVENOUS

## 2014-02-02 MED ORDER — BENZONATATE 100 MG PO CAPS
200.0000 mg | ORAL_CAPSULE | Freq: Once | ORAL | Status: AC
  Administered 2014-02-02: 200 mg via ORAL
  Filled 2014-02-02: qty 2

## 2014-02-02 MED ORDER — BENZONATATE 200 MG PO CAPS: 200.0000 mg | ORAL_CAPSULE | Freq: Three times a day (TID) | ORAL | Status: DC | PRN

## 2014-02-02 NOTE — ED Notes (Signed)
Patient c/o flu like symptoms. Patient c/o body aches, headache, diarrhea, vomiting, cough, and fevers that started yesterday. Per patient temp was 100.3 this morning before coming to hospital. Denies taking any medications. Because of vomiting.

## 2014-02-02 NOTE — ED Notes (Signed)
Pt given sprite and crackers per request - ate and tolerated well.  Requesting to go home.  PA notified.

## 2014-02-02 NOTE — Discharge Instructions (Signed)
Influenza, Adult Influenza ("the flu") is a viral infection of the respiratory tract. It occurs more often in winter months because people spend more time in close contact with one another. Influenza can make you feel very sick. Influenza easily spreads from person to person (contagious). CAUSES  Influenza is caused by a virus that infects the respiratory tract. You can catch the virus by breathing in droplets from an infected person's cough or sneeze. You can also catch the virus by touching something that was recently contaminated with the virus and then touching your mouth, nose, or eyes. SYMPTOMS  Symptoms typically last 4 to 10 days and may include:  Fever.  Chills.  Headache, body aches, and muscle aches.  Sore throat.  Chest discomfort and cough.  Poor appetite.  Weakness or feeling tired.  Dizziness.  Nausea or vomiting. DIAGNOSIS  Diagnosis of influenza is often made based on your history and a physical exam. A nose or throat swab test can be done to confirm the diagnosis. RISKS AND COMPLICATIONS You may be at risk for a more severe case of influenza if you smoke cigarettes, have diabetes, have chronic heart disease (such as heart failure) or lung disease (such as asthma), or if you have a weakened immune system. Elderly people and pregnant women are also at risk for more serious infections. The most common complication of influenza is a lung infection (pneumonia). Sometimes, this complication can require emergency medical care and may be life-threatening. PREVENTION  An annual influenza vaccination (flu shot) is the best way to avoid getting influenza. An annual flu shot is now routinely recommended for all adults in the [place] TREATMENT  In mild cases, influenza goes away on its own. Treatment is directed at relieving symptoms. For more severe cases, your caregiver may prescribe antiviral medicines to shorten the sickness. Antibiotic medicines are not effective, because the  infection is caused by a virus, not by bacteria. HOME CARE INSTRUCTIONS  Only take over-the-counter or prescription medicines for pain, discomfort, or fever as directed by your caregiver.  Use a cool mist humidifier to make breathing easier.  Get plenty of rest until your temperature returns to normal. This usually takes 3 to 4 days.  Drink enough fluids to keep your urine clear or pale yellow.  Cover your mouth and nose when coughing or sneezing, and wash your hands well to avoid spreading the virus.  Stay home from work or school until your fever has been gone for at least 1 full day. SEEK MEDICAL CARE IF:   You have chest pain or a deep cough that worsens or produces more mucus.  You have nausea, vomiting, or diarrhea. SEEK IMMEDIATE MEDICAL CARE IF:   You have difficulty breathing, shortness of breath, or your skin or nails turn bluish.  You have severe neck pain or stiffness.  You have a severe headache, facial pain, or earache.  You have a worsening or recurring fever.  You have nausea or vomiting that cannot be controlled. MAKE SURE YOU:  Understand these instructions.  Will watch your condition.  Will get help right away if you are not doing well or get worse. Document Released: 11/30/2000 Document Revised: 06/03/2012 Document Reviewed: 03/03/2012 [facility] Patient Information 2014 [place] [place].   Your symptoms are suggestive of influenza.  Rest,  Make sure you are drinking plenty of fluids.  You may use the medicines prescribed for cough, nausea and diarrhea.  As discussed,  Only continue taking imodium if your diarrhea persists, as you  could get constipation if you continue taking it and don't need it!  Take motrin or tylenol for fever reduction.  Get rechecked for any worsening symptoms or uncontrolled fever, weakness, shortness of breath.

## 2014-02-06 NOTE — ED Provider Notes (Signed)
CSN: [id number]     Arrival date & time 02/02/14  1015 History   First MD Initiated Contact with Patient 02/02/14 1049     Chief Complaint  Patient presents with  . Emesis     (Consider location/radiation/quality/duration/timing/severity/associated sxs/prior Treatment) HPI Comments: Tricia Baker is a 63 y.o. Female presenting with flu like symptoms which started yesterday, rather suddenly with generalized body aches, non productive cough and with fever to 100.3, generalized headache without neck pain or stiffness and now with non bloody non bilious emesis and diarrhea.  She reports being unable to keep anything PO down since early yesterday evening and also was up most of last night with frequent vomiting and diarrhea.  She endorses fatigue due to sx and lack of sleep last night.  She attempted to take pepto bismol last night but did not stay down.  Her past medical history is significant for acid reflux.  She has found no alleviators. She denies localizing weakness, shortness of breath, chest pain, dysuria, abdominal pain, although has cramping which improves after diarrhea.    The history is provided by the patient.    Past Medical History  Diagnosis Date  . Psoriasis   . Reflux   . Arthritis   . Fluid retention    Past Surgical History  Procedure Laterality Date  . Appendectomy    . Abdominal hysterectomy    . Vocal cord surgery    . Shoulder surgery Right    Family History  Problem Relation Age of Onset  . Stroke Mother   . Diabetes Father    History  Substance Use Topics  . Smoking status: Current Every Day Smoker -- 0.05 packs/day for 45 years    Types: Cigarettes  . Smokeless tobacco: Never Used  . Alcohol Use: Yes     Comment: occasional   OB History   Grav Para Term Preterm Abortions TAB SAB Ect Mult Living   2 1 1  1  1   1      Review of Systems  Constitutional: Positive for fever, chills and fatigue.  HENT: Negative for congestion and sore throat.    Eyes: Negative.   Respiratory: Positive for cough. Negative for chest tightness, shortness of breath, wheezing and stridor.   Cardiovascular: Negative for chest pain.  Gastrointestinal: Positive for nausea, vomiting and diarrhea. Negative for abdominal pain.  Genitourinary: Negative.   Musculoskeletal: Negative for arthralgias, joint swelling and neck pain.  Skin: Negative.  Negative for rash and wound.  Neurological: Negative for dizziness, weakness, light-headedness, numbness and headaches.  Psychiatric/Behavioral: Negative.       Allergies  Bee venom; Penicillins; Sulfa antibiotics; and Nickel  Home Medications   Current Outpatient Rx  Name  Route  Sig  Dispense  Refill  . albuterol (PROVENTIL HFA;VENTOLIN HFA) 108 (90 BASE) MCG/ACT inhaler   Inhalation   Inhale 2 puffs into the lungs every 6 (six) hours as needed. For shortness of breath due to allergic reactions         . calcium-vitamin D (OSCAL WITH D) 500-200 MG-UNIT per tablet   Oral   Take 1 tablet by mouth at bedtime.         Marland Kitchen EPINEPHrine (EPI-PEN) 0.3 mg/0.3 mL DEVI   Intramuscular   Inject 0.3 mg into the muscle once.         Marland Kitchen esomeprazole (NEXIUM) 40 MG capsule   Oral   Take 40 mg by mouth daily before breakfast.         .  famotidine (PEPCID) 40 MG tablet   Oral   Take 40 mg by mouth daily.         . hydrochlorothiazide (HYDRODIURIL) 25 MG tablet   Oral   Take 25 mg by mouth every morning.         . meloxicam (MOBIC) 15 MG tablet   Oral   Take 15 mg by mouth daily.         Marland Kitchen. Ustekinumab (STELARA) 90 MG/ML SOLN   Subcutaneous   Inject 90 mg into the skin every 3 (three) months.         . benzonatate (TESSALON) 200 MG capsule   Oral   Take 1 capsule (200 mg total) by mouth 3 (three) times daily as needed for cough.   21 capsule   0   . ondansetron (ZOFRAN) 8 MG tablet   Oral   Take 1 tablet (8 mg total) by mouth every 8 (eight) hours as needed for nausea or vomiting.   12  tablet   0    BP 112/61  Pulse 87  Temp(Src) 97.9 F (36.6 C) (Oral)  Resp 16  Ht 5\' 6"  (1.676 m)  Wt 197 lb (89.359 kg)  BMI 31.81 kg/m2  SpO2 95% Physical Exam  Nursing note and vitals reviewed. Constitutional: She appears well-developed and well-nourished.  HENT:  Head: Normocephalic and atraumatic.  Mouth/Throat: Uvula is midline. Mucous membranes are dry. No posterior oropharyngeal erythema.  Eyes: Conjunctivae are normal.  Neck: Normal range of motion. Neck supple. Normal range of motion present.  Cardiovascular: Normal rate, regular rhythm, normal heart sounds and intact distal pulses.   Pulmonary/Chest: Effort normal and breath sounds normal. She has no decreased breath sounds. She has no wheezes. She has no rhonchi. She has no rales.  Abdominal: Soft. Bowel sounds are normal. There is no hepatosplenomegaly. There is no tenderness. There is no CVA tenderness.  Musculoskeletal: Normal range of motion.  Neurological: She is alert.  Skin: Skin is warm and dry. No rash noted.  Psychiatric: She has a normal mood and affect.    ED Course  Procedures (including critical care time) Labs Review Labs Reviewed  CBC WITH DIFFERENTIAL - Abnormal; Notable for the following:    WBC 13.7 (*)    RBC 5.16 (*)    Hemoglobin 15.8 (*)    HCT 46.5 (*)    Neutrophils Relative % 91 (*)    Neutro Abs 12.4 (*)    Lymphocytes Relative 5 (*)    Lymphs Abs 0.6 (*)    All other components within normal limits  COMPREHENSIVE METABOLIC PANEL - Abnormal; Notable for the following:    Glucose, Bld 115 (*)    Total Protein 8.6 (*)    GFR calc non Af Amer 89 (*)    All other components within normal limits  INFLUENZA PANEL BY PCR (TYPE A & B, H1N1)   Imaging Review No results found.  EKG Interpretation   None       MDM   Final diagnoses:  Gastroenteritis  Flu-like symptoms    Patients labs and/or radiological studies were viewed and considered during the medical decision making  and disposition process.  Pt was given IV flids, tessalon for cough which was effective, and zofran for nausea, toradol for headache pain.  She was able to tolerate PO fluids after this tx and also ate crackers with no trigger of n/v. She was prescribed zofran and tessalon for home use, encouraged increase in fluid intake  and rest.  Also given imodium dose prior to dc home. Discussed lomotil, pt prefers imodium stating very sensitive to anti diarrheals, last time she was given lomotil, she had severe constipation.   She felt better and ready for dc home after PO challenge.  Encouraged recheck by pcp or return here for any worsened sx.  The patient appears reasonably screened and/or stabilized for discharge and I doubt any other medical condition or other [facility] requiring further screening, evaluation, or treatment in the ED at this time prior to discharge.      Burgess Amor, PA-C 02/06/14 2348

## 2014-02-07 NOTE — ED Provider Notes (Signed)
Medical screening examination/treatment/procedure(s) were performed by non-physician practitioner and as supervising physician I was immediately available for consultation/collaboration.     Favio Moder, MD 02/07/14 0153 

## 2014-10-18 ENCOUNTER — Encounter (HOSPITAL_COMMUNITY): Payer: Self-pay | Admitting: Emergency Medicine

## 2014-12-20 ENCOUNTER — Ambulatory Visit: Payer: Federal, State, Local not specified - PPO | Attending: Student | Admitting: Physical Therapy

## 2014-12-20 DIAGNOSIS — M179 Osteoarthritis of knee, unspecified: Secondary | ICD-10-CM | POA: Insufficient documentation

## 2014-12-20 DIAGNOSIS — M25562 Pain in left knee: Secondary | ICD-10-CM | POA: Diagnosis not present

## 2015-01-31 ENCOUNTER — Encounter: Payer: Federal, State, Local not specified - PPO | Admitting: Physical Therapy

## 2015-02-03 ENCOUNTER — Encounter: Payer: Federal, State, Local not specified - PPO | Admitting: *Deleted

## 2015-02-07 ENCOUNTER — Encounter: Payer: Federal, State, Local not specified - PPO | Admitting: Physical Therapy

## 2015-02-10 ENCOUNTER — Encounter: Payer: Federal, State, Local not specified - PPO | Admitting: *Deleted

## 2015-02-15 ENCOUNTER — Ambulatory Visit: Payer: Federal, State, Local not specified - PPO | Attending: Student | Admitting: Physical Therapy

## 2015-02-15 ENCOUNTER — Encounter: Payer: Self-pay | Admitting: Physical Therapy

## 2015-02-15 DIAGNOSIS — M179 Osteoarthritis of knee, unspecified: Secondary | ICD-10-CM | POA: Diagnosis present

## 2015-02-15 DIAGNOSIS — M25662 Stiffness of left knee, not elsewhere classified: Secondary | ICD-10-CM

## 2015-02-15 DIAGNOSIS — M25562 Pain in left knee: Secondary | ICD-10-CM | POA: Diagnosis not present

## 2015-02-15 NOTE — Therapy (Signed)
[facility] [address] [place], [place], [address] Phone: [phone]   Fax:  [phone]  Physical Therapy Evaluation  Patient Details  Name: Tricia LawmanSara S Baker MRN: [id number] Date of Birth: [birth year] Referring Provider:  Remus LofflerJones, Angel S, PA-C  Encounter Date: 02/15/2015      PT End of Session - 02/15/15 1530    Visit Number 1   Number of Visits 12   PT Start Time 0310   PT Stop Time 0403   PT Time Calculation (min) 53 min   Activity Tolerance Patient tolerated treatment well   Behavior During Therapy [facility] for tasks assessed/performed      Past Medical History  Diagnosis Date  . Psoriasis   . Reflux   . Arthritis   . Fluid retention     Past Surgical History  Procedure Laterality Date  . Appendectomy    . Abdominal hysterectomy    . Vocal cord surgery    . Shoulder surgery Right     There were no vitals taken for this visit.  Visit Diagnosis:  Left knee pain - Plan: PT plan of care cert/re-cert  Knee stiffness, left - Plan: PT plan of care cert/re-cert      Subjective Assessment - 02/15/15 1523    Symptoms Tired of wearing these TED hoses!!   Currently in Pain? Yes   Pain Score 6    Pain Location Knee   Pain Orientation Left   Pain Descriptors / Indicators Aching   Pain Type Surgical pain   Pain Onset 1 to 4 weeks ago   Pain Frequency Constant   Aggravating Factors  Bending knee.   Pain Relieving Factors Rest.          [facility] - 02/15/15 0001    Assessment   Medical Diagnosis Left Total knee arthroplasty   Onset Date 01/26/15   Precautions   Precautions --  No ultrasound.   Restrictions   Weight Bearing Restrictions No   Balance Screen   Has the patient fallen in the past 6 months No   Has the patient had a decrease in activity level because of a fear of falling?  No   Is the patient reluctant to leave their home because of a fear of falling?  No   Home Environment   Living  Enviornment Private residence   ROM / Strength   AROM / PROM / Strength AROM;PROM;Strength   AROM   Overall AROM Comments -5 to 55 degrees left knee   PROM   Overall PROM Comments -3 to 58 degrees left knee   Strength   Overall Strength Comments Left hip and knee strength= 4+/5.   Palpation   Palpation Tender to palpation over left lateral knee.   Special Tests    Special Tests Swelling Tests   Swelling Tests other   other   Findings --  Left 2.5 cms > right at mid-pat region.   Ambulation/Gait   Ambulation/Gait Yes   Ambulation/Gait Assistance 5: Supervision   Assistive device Straight cane   Gait Pattern --  Stiff left legged gait pattern.                  [facility] Adult PT Treatment/Exercise - 02/15/15 0001    Exercises   Exercises Knee/Hip   Knee/Hip Exercises: Aerobic   Stationary Bike Nustep Level 2 at seat 10 for 5 minutes and seat 9 for 5 minutes for a total of 10 minutes   Modalities  Modalities Electrical Stimulation;Cryotherapy   Cryotherapy   Number Minutes Cryotherapy 15 Minutes   Cryotherapy Location Knee   Type of Cryotherapy Other (comment)  Vasopnuematic x 15 minutes   Electrical Stimulation   Electrical Stimulation Location --  Left knee.   Electrical Stimulation Parameters 1-10 HZ   Electrical Stimulation Goals Pain;Edema                     PT Long Term Goals - 02/15/15 1552    PT LONG TERM GOAL #1   Title Ind with advanced HEP.   Time 6   Period Weeks   Status New   PT LONG TERM GOAL #2   Title Full left knee extension.   Time 6   Period Weeks   Status New   PT LONG TERM GOAL #3   Title Active left knee flexion= 115 degrees.   Time 6   Period Weeks   Status New   PT LONG TERM GOAL #4   Title Perform ADL's with pain not > 3/10   PT LONG TERM GOAL #5   Title Patient perform a reciprocating stair gait with one railing.               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  The patient had 2 weeks of HH PT and is compliant to her HEP.     Rehab Potential Good   PT Frequency 3x / week   PT Duration 6 weeks   PT Treatment/Interventions Patient/family education;Passive range of motion;Gait training;Manual techniques;Cryotherapy;Electrical Stimulation;Neuromuscular re-education   PT Next Visit Plan Left TKA protocol.  Patient needs work on flexion.   Consulted and Agree with Plan of Care Patient         Problem List There are no active problems to display for this patient.   Zohra Clavel, [place] MPT 02/15/2015, 4:07 PM  [facility] [address] [place], [place], [address] Phone: [phone]   Fax:  [phone]

## 2015-02-17 ENCOUNTER — Encounter: Payer: Self-pay | Admitting: *Deleted

## 2015-02-17 ENCOUNTER — Ambulatory Visit: Payer: Federal, State, Local not specified - PPO | Admitting: *Deleted

## 2015-02-17 DIAGNOSIS — M179 Osteoarthritis of knee, unspecified: Secondary | ICD-10-CM | POA: Diagnosis not present

## 2015-02-17 DIAGNOSIS — M25562 Pain in left knee: Secondary | ICD-10-CM

## 2015-02-17 DIAGNOSIS — M25662 Stiffness of left knee, not elsewhere classified: Secondary | ICD-10-CM

## 2015-02-17 NOTE — Therapy (Signed)
[facility] [place], [place], [address] Phone: [phone]   Fax:  [phone]  Physical Therapy Treatment  Patient Details  Name: Tricia Baker MRN: [id number] Date of Birth: 13-Apr-1951 Referring Provider:  Terald Sleeper, PA-C  Encounter Date: 02/17/2015      PT End of Session - 02/17/15 1648    Visit Number 2   Number of Visits 12   Date for PT Re-Evaluation 03/29/15   PT Start Time 1601   PT Stop Time 2458   PT Time Calculation (min) 58 min      Past Medical History  Diagnosis Date  . Psoriasis   . Reflux   . Arthritis   . Fluid retention     Past Surgical History  Procedure Laterality Date  . Appendectomy    . Abdominal hysterectomy    . Vocal cord surgery    . Shoulder surgery Right     There were no vitals taken for this visit.  Visit Diagnosis:  Left knee pain  Knee stiffness, left      Subjective Assessment - 02/17/15 1604    Symptoms LT knee  is doing a little better   Currently in Pain? Yes   Pain Score 5    Pain Location Knee   Pain Orientation Left   Pain Descriptors / Indicators Aching   Aggravating Factors  the stocking, up too long, lying flat   Pain Relieving Factors rest,ice                    [facility] Adult PT Treatment/Exercise - 02/17/15 0001    Knee/Hip Exercises: Aerobic   Stationary Bike Nustep L3,seat9,8 x 15 min for ROM progression   Knee/Hip Exercises: Standing   Rocker Board 3 minutes   Other Standing Knee Exercises 12 in. flexion stretches LT knee   Knee/Hip Exercises: Seated   Long Arc Quad Strengthening;10 reps;Weights   Long Arc Quad Weight 2 lbs.   Cryotherapy   Number Minutes Cryotherapy 15 Minutes   Cryotherapy Location Knee   Type of Cryotherapy Ice pack  Vasopneumatic    Manual Therapy   Manual Therapy Passive ROM;Massage   Massage scar mobs   Passive ROM patella mobs, ext and flexion stretching with pt supine                      PT Long Term Goals - 02/15/15 1552    PT LONG TERM GOAL #1   Title Ind with advanced HEP.   Time 6   Period Weeks   Status New   PT LONG TERM GOAL #2   Title Full left knee extension.   Time 6   Period Weeks   Status New   PT LONG TERM GOAL #3   Title Active left knee flexion= 115 degrees.   Time 6   Period Weeks   Status New   PT LONG TERM GOAL #4   Title Perform ADL's with pain not > 3/10   PT LONG TERM GOAL #5   Title Patient perform a reciprocating stair gait with one railing.               Plan - 02/17/15 1652    Clinical Impression Statement did well with Rx. needs to work on knee flexion   Rehab Potential Good   PT Frequency 3x / week   PT Duration 6 weeks   PT Treatment/Interventions Patient/family education;Passive range of motion;Gait training;Manual techniques;Cryotherapy;Dealer  Stimulation;Neuromuscular re-education   PT Next Visit Plan Left TKA protocol.  Patient needs work on flexion. NO new goals met today   Consulted and Agree with Plan of Care Patient        Problem List There are no active problems to display for this patient.   RAMSEUR,CHRIS PTA 02/17/2015, 5:00 PM  [facility] [place], [place], [address] Phone: [phone]   Fax:  [phone]

## 2015-02-22 ENCOUNTER — Ambulatory Visit: Payer: Federal, State, Local not specified - PPO | Admitting: Physical Therapy

## 2015-02-24 ENCOUNTER — Ambulatory Visit: Payer: Federal, State, Local not specified - PPO | Admitting: Physical Therapy

## 2015-02-24 DIAGNOSIS — M25562 Pain in left knee: Secondary | ICD-10-CM

## 2015-02-24 DIAGNOSIS — M179 Osteoarthritis of knee, unspecified: Secondary | ICD-10-CM | POA: Diagnosis not present

## 2015-02-24 DIAGNOSIS — M25662 Stiffness of left knee, not elsewhere classified: Secondary | ICD-10-CM

## 2015-02-24 NOTE — Therapy (Signed)
[facility] [place], [place], [address] Phone: [phone]   Fax:  [phone]  Physical Therapy Treatment  Patient Details  Name: Tricia Baker MRN: [id number] Date of Birth: 24-Oct-1951 Referring Provider:  Terald Sleeper, PA-C  Encounter Date: 02/24/2015      PT End of Session - 02/24/15 1631    Visit Number 3   Number of Visits 12   Date for PT Re-Evaluation 03/29/15   PT Start Time 0403   PT Stop Time 0514   PT Time Calculation (min) 71 min   Behavior During Therapy [facility] for tasks assessed/performed      Past Medical History  Diagnosis Date  . Psoriasis   . Reflux   . Arthritis   . Fluid retention     Past Surgical History  Procedure Laterality Date  . Appendectomy    . Abdominal hysterectomy    . Vocal cord surgery    . Shoulder surgery Right     There were no vitals filed for this visit.  Visit Diagnosis:  Left knee pain  Knee stiffness, left      Subjective Assessment - 02/24/15 1608    Symptoms Knee feels "thick".  6/10.  Did a 400 mile drive and been walking a lot in the hospital seeing my father.  Patient does not want her knee pushed too hard today.   Pain Score 6    Pain Location Knee   Pain Orientation Left   Pain Descriptors / Indicators Aching;Constant   Pain Type Surgical pain   Pain Onset 1 to 4 weeks ago   Pain Frequency Constant   Aggravating Factors  A lot of walking.   Pain Relieving Factors Rest and ice.   Multiple Pain Sites No                       [facility] Adult PT Treatment/Exercise - 02/24/15 0001    Knee/Hip Exercises: Aerobic   Stationary Bike Nustep level 3 times 15 minutes   Knee/Hip Exercises: Standing   Forward Lunges 10 reps   Rocker Board 4 minutes   Cryotherapy   Number Minutes Cryotherapy 15 Minutes   Cryotherapy Location Knee   Type of Cryotherapy Other (comment)  Medium vasopneumatic.   Civil engineer, contracting Location --  Left knee.   Electrical Stimulation Parameters 1-10 HZ  x 15 minutes.   Manual Therapy   Passive ROM Left knee patellar mobs and passive stretching into flexion an dextension x 10 minutes.                     PT Long Term Goals - 02/15/15 1552    PT LONG TERM GOAL #1   Title Ind with advanced HEP.   Time 6   Period Weeks   Status New   PT LONG TERM GOAL #2   Title Full left knee extension.   Time 6   Period Weeks   Status New   PT LONG TERM GOAL #3   Title Active left knee flexion= 115 degrees.   Time 6   Period Weeks   Status New   PT LONG TERM GOAL #4   Title Perform ADL's with pain not > 3/10   PT LONG TERM GOAL #5   Title Patient perform a reciprocating stair gait with one railing.               Plan - 02/24/15  1657    Rehab Potential Good   PT Frequency 3x / week   PT Duration 6 weeks   PT Treatment/Interventions Patient/family education;Passive range of motion;Gait training;Manual techniques;Cryotherapy;Electrical Stimulation;Neuromuscular re-education   PT Next Visit Plan Left TKA protocol.  Patient needs work on flexion. NO new goals met today        Problem List There are no active problems to display for this patient.   Stokely Jeancharles, [place] MPT 02/24/2015, 5:19 PM  [facility] [address] [place], [place], [address] Phone: [phone]   Fax:  [phone]

## 2015-02-25 ENCOUNTER — Encounter: Payer: Federal, State, Local not specified - PPO | Admitting: Physical Therapy

## 2015-03-01 ENCOUNTER — Ambulatory Visit: Payer: Federal, State, Local not specified - PPO | Admitting: Physical Therapy

## 2015-03-01 ENCOUNTER — Encounter: Payer: Self-pay | Admitting: Physical Therapy

## 2015-03-01 DIAGNOSIS — M25562 Pain in left knee: Secondary | ICD-10-CM

## 2015-03-01 DIAGNOSIS — M25662 Stiffness of left knee, not elsewhere classified: Secondary | ICD-10-CM

## 2015-03-01 DIAGNOSIS — M179 Osteoarthritis of knee, unspecified: Secondary | ICD-10-CM | POA: Diagnosis not present

## 2015-03-01 NOTE — Patient Instructions (Signed)
Chair Knee Flexion   Keeping feet on floor, slide foot of operated leg back, bending knee. Hold __10__ seconds. Repeat _10__ times. Do __4-5__ sessions a day.  Heel Slide   Bend left knee and pull heel toward buttocks. Use strap around foot and pull strap with arms to assist knee to bend further. Hold 10 secs.  Repeat _10__ times. Do _4 to 5__ sessions per day.  Patient also doing step lunges at home.

## 2015-03-01 NOTE — Therapy (Signed)
[facility] [address] [place], [place], [address] Phone: [phone]   Fax:  [phone]  Physical Therapy Treatment  Patient Details  Name: Tricia Baker MRN: [id number] Date of Birth: [birth year] Referring Provider:  Remus LofflerJones, Angel S, PA-C  Encounter Date: 03/01/2015      PT End of Session - 03/01/15 1621    Visit Number 4   Number of Visits 12   PT Start Time 0342   PT Stop Time 0444   PT Time Calculation (min) 62 min   Activity Tolerance Patient tolerated treatment well   Behavior During Therapy [name] for tasks assessed/performed      Past Medical History  Diagnosis Date  . Psoriasis   . Reflux   . Arthritis   . Fluid retention     Past Surgical History  Procedure Laterality Date  . Appendectomy    . Abdominal hysterectomy    . Vocal cord surgery    . Shoulder surgery Right     There were no vitals filed for this visit.  Visit Diagnosis:  Left knee pain  Knee stiffness, left      Subjective Assessment - 03/01/15 1628    Symptoms Knee still feels thick.                       [facility] PT Treatment/Exercise - 03/01/15 1631    Knee/Hip Exercises: Standing   Rocker Board 4 minutes   Modalities   Modalities --  Medium vasopneumatic x 20 minutes.   Programme researcher, broadcasting/film/videolectrical Stimulation   Electrical Stimulation Location left knee   Electrical Stimulation Parameters 80-150 Hz x 20 minutes   Manual Therapy   Passive ROM --  Low load left knee flexion stretch x 8 minutes.                     PT Long Term Goals - 02/15/15 1552    PT LONG TERM GOAL #1   Title Ind with advanced HEP.   Time 6   Period Weeks   Status New   PT LONG TERM GOAL #2   Title Full left knee extension.   Time 6   Period Weeks   Status New   PT LONG TERM GOAL #3   Title Active left knee flexion= 115 degrees.   Time 6   Period Weeks   Status New   PT LONG TERM GOAL #4   Title Perform ADL's with pain not > 3/10    PT LONG TERM GOAL #5   Title Patient perform a reciprocating stair gait with one railing.               Plan - 03/01/15 1632    PT Next Visit Plan MD note and goal assessment.        Problem List There are no active problems to display for this patient.   Leala Bryand, [name] 03/01/2015, 4:51 PM  [facility] [address] [place], [place], [address] Phone: [phone]   Fax:  [phone]

## 2015-03-03 ENCOUNTER — Ambulatory Visit: Payer: Federal, State, Local not specified - PPO | Admitting: Physical Therapy

## 2015-03-03 ENCOUNTER — Encounter: Payer: Self-pay | Admitting: Physical Therapy

## 2015-03-03 DIAGNOSIS — M25562 Pain in left knee: Secondary | ICD-10-CM

## 2015-03-03 DIAGNOSIS — M179 Osteoarthritis of knee, unspecified: Secondary | ICD-10-CM | POA: Diagnosis not present

## 2015-03-03 DIAGNOSIS — M25662 Stiffness of left knee, not elsewhere classified: Secondary | ICD-10-CM

## 2015-03-03 NOTE — Therapy (Signed)
[facility] [place], [place], [address] Phone: [phone]   Fax:  [phone]  Physical Therapy Treatment  Patient Details  Name: Tricia Baker MRN: [id number] Date of Birth: 28-May-1951 Referring Provider:  Terald Sleeper, PA-C  Encounter Date: 03/03/2015      PT End of Session - 03/03/15 1657    Visit Number 5   Number of Visits 12   Date for PT Re-Evaluation 03/29/15   PT Start Time 1550   PT Stop Time 1650   PT Time Calculation (min) 60 min   Activity Tolerance Patient tolerated treatment well   Behavior During Therapy [facility] for tasks assessed/performed      Past Medical History  Diagnosis Date  . Psoriasis   . Reflux   . Arthritis   . Fluid retention     Past Surgical History  Procedure Laterality Date  . Appendectomy    . Abdominal hysterectomy    . Vocal cord surgery    . Shoulder surgery Right     There were no vitals filed for this visit.  Visit Diagnosis:  Left knee pain  Knee stiffness, left      Subjective Assessment - 03/03/15 1552    Symptoms Patient states that she has not been sleeping well. She reports that received a Stelera shot for psoriasis and has been running a temperature with a headache today. Reports it does not feel as thick today. Has been walking, sitting in swing, stretching and icing before she came into appointment. Has been fixing meals, dusting, washing clothes today. Reports compliance with HEP. Shows interest in self directed gym program at facility after discharge.   Limitations Sitting   Currently in Pain? Yes   Pain Score 2    Pain Location Knee   Pain Orientation Left   Pain Descriptors / Indicators Dull;Throbbing   Pain Type Surgical pain   Aggravating Factors  Sitting   Pain Relieving Factors Rest and ice            [facility] PT Assessment - 03/03/15 0001    Assessment   Medical Diagnosis Left Total knee arthroplasty   Onset Date 01/26/15   ROM /  Strength   AROM / PROM / Strength AROM   AROM   Overall AROM  Deficits   AROM Assessment Site Knee   Right/Left Knee Left   Left Knee Extension 0   Left Knee Flexion 60                   [facility] Adult PT Treatment/Exercise - 03/03/15 0001    Knee/Hip Exercises: Aerobic   Stationary Bike Nustep workload 4, seat 8 x 15 minutes   Knee/Hip Exercises: Standing   Forward Step Up Left;3 sets;10 reps;Step Height: 6"   Rocker Board 3 minutes   Modalities   Modalities Electrical Stimulation;Cryotherapy   Cryotherapy   Number Minutes Cryotherapy 15 Minutes   Cryotherapy Location Knee   Type of Cryotherapy Other (comment)  vasopneumatic   Electrical Stimulation   Electrical Stimulation Location left knee   Electrical Stimulation Action IFC   Electrical Stimulation Parameters 1-10 HZ   Electrical Stimulation Goals Pain;Edema   Manual Therapy   Manual Therapy Passive ROM   Passive ROM Left knee patellar mobs and passive stretching into flexion an dextension x 10 minutes.                     PT Long Term Goals - 03/03/15  [place] #1   Title Ind with advanced HEP.   Time 6   Period Weeks   Status On-going   PT LONG TERM GOAL #2   Title Full left knee extension.   Time 6   Period Weeks   Status Achieved   PT LONG TERM GOAL #3   Title Active left knee flexion= 115 degrees.   Time 6   Period Weeks   Status On-going   PT LONG TERM GOAL #4   Title Perform ADL's with pain not > 3/10   Time 6   Period Weeks   Status On-going   PT LONG TERM GOAL #5   Title Patient perform a reciprocating stair gait with one railing.   Time 6   Period Weeks   Status On-going               Plan - 03/03/15 1645    Clinical Impression Statement Patient tolerated treatment well. Required architectural blocking during forward step ups to prevent left hip circumduction and compensatory hip strategies.  Long term goal #2 regarding full left knee extension  was met during today's session. L knee ROM continues to improve. Continues to have minimal- moderate swelliing in L knee. Normal cryotherapy and e-stim response observed after removal.  Following treatment patient reported 2/10 and patient expressed that her L knee did not feel as full.   Rehab Potential Good   PT Frequency 3x / week   PT Duration 6 weeks   PT Treatment/Interventions Patient/family education;Passive range of motion;Gait training;Manual techniques;Cryotherapy;Electrical Stimulation;Neuromuscular re-education   PT Next Visit Plan Continue PT POC. Continue to encourage left knee flexion.   Consulted and Agree with Plan of Care Patient        Problem List There are no active problems to display for this patient.   Wynelle Fanny, PTA 03/03/2015, 5:08 PM  [facility] [address] [place], [place], [address] Phone: [phone]   Fax:  [phone]

## 2015-03-03 NOTE — Therapy (Signed)
[facility] [place], [place], [address] Phone: [phone]   Fax:  [phone]  Physical Therapy Treatment  Patient Details  Name: Tricia Baker MRN: [id number] Date of Birth: Aug 30, 1951 Referring Provider:  Terald Sleeper, PA-C  Encounter Date: 03/03/2015      PT End of Session - 03/03/15 1657    Visit Number 5   Number of Visits 12   Date for PT Re-Evaluation 03/29/15   PT Start Time 1550   PT Stop Time 1650   PT Time Calculation (min) 60 min   Activity Tolerance Patient tolerated treatment well   Behavior During Therapy [facility] for tasks assessed/performed      Past Medical History  Diagnosis Date  . Psoriasis   . Reflux   . Arthritis   . Fluid retention     Past Surgical History  Procedure Laterality Date  . Appendectomy    . Abdominal hysterectomy    . Vocal cord surgery    . Shoulder surgery Right     There were no vitals filed for this visit.  Visit Diagnosis:  Left knee pain  Knee stiffness, left      Subjective Assessment - 03/03/15 1552    Symptoms Patient states that she has not been sleeping well. She reports that received a Stelera shot for psoriasis and has been running a temperature with a headache today. Reports it does not feel as thick today. Has been walking, sitting in swing, stretching and icing before she came into appointment. Has been fixing meals, dusting, washing clothes today. Reports compliance with HEP. Shows interest in self directed gym program at facility after discharge.   Limitations Sitting   Currently in Pain? Yes   Pain Score 2    Pain Location Knee   Pain Orientation Left   Pain Descriptors / Indicators Dull;Throbbing   Pain Type Surgical pain   Aggravating Factors  Sitting   Pain Relieving Factors Rest and ice            [facility] PT Assessment - 03/03/15 0001    Assessment   Medical Diagnosis Left Total knee arthroplasty   Onset Date 01/26/15   ROM /  Strength   AROM / PROM / Strength AROM   AROM   Overall AROM  Deficits   AROM Assessment Site Knee   Right/Left Knee Left   Left Knee Extension 0   Left Knee Flexion 60                   [facility] Adult PT Treatment/Exercise - 03/03/15 0001    Knee/Hip Exercises: Aerobic   Stationary Bike Nustep workload 4, seat 8 x 15 minutes   Knee/Hip Exercises: Standing   Forward Step Up Left;3 sets;10 reps;Step Height: 6"   Rocker Board 3 minutes   Modalities   Modalities Electrical Stimulation;Cryotherapy   Cryotherapy   Number Minutes Cryotherapy 15 Minutes   Cryotherapy Location Knee   Type of Cryotherapy Other (comment)  vasopneumatic   Electrical Stimulation   Electrical Stimulation Location left knee   Electrical Stimulation Action IFC   Electrical Stimulation Parameters 1-10 HZ   Electrical Stimulation Goals Pain;Edema   Manual Therapy   Manual Therapy Passive ROM   Passive ROM Left knee patellar mobs and passive stretching into flexion an dextension x 10 minutes.                     PT Long Term Goals - 03/03/15  [place] #1   Title Ind with advanced HEP.   Time 6   Period Weeks   Status On-going   PT LONG TERM GOAL #2   Title Full left knee extension.   Time 6   Period Weeks   Status Achieved   PT LONG TERM GOAL #3   Title Active left knee flexion= 115 degrees.   Time 6   Period Weeks   Status On-going   PT LONG TERM GOAL #4   Title Perform ADL's with pain not > 3/10   Time 6   Period Weeks   Status On-going   PT LONG TERM GOAL #5   Title Patient perform a reciprocating stair gait with one railing.   Time 6   Period Weeks   Status On-going               Plan - 03/03/15 1645    Clinical Impression Statement Patient tolerated treatment well. Required architectural blocking during forward step ups to prevent left hip circumduction and compensatory hip strategies.  Long term goal #2 regarding full left knee extension  was met during today's session. L knee ROM continues to improve. Continues to have minimal- moderate swelliing in L knee. Normal cryotherapy and e-stim response observed after removal.  Following treatment patient reported 2/10 and patient expressed that her L knee did not feel as full.   Rehab Potential Good   PT Frequency 3x / week   PT Duration 6 weeks   PT Treatment/Interventions Patient/family education;Passive range of motion;Gait training;Manual techniques;Cryotherapy;Electrical Stimulation;Neuromuscular re-education   PT Next Visit Plan Continue PT POC. Continue to encourage left knee flexion.   Consulted and Agree with Plan of Care Patient        Problem List There are no active problems to display for this patient.   Chaselynn Kepple, [place] MPT 03/03/2015, 5:15 PM  [facility] [address] [place], [place], [address] Phone: [phone]   Fax:  [phone]

## 2015-03-08 ENCOUNTER — Ambulatory Visit: Payer: Federal, State, Local not specified - PPO | Admitting: Physical Therapy

## 2015-03-08 ENCOUNTER — Encounter: Payer: Self-pay | Admitting: Physical Therapy

## 2015-03-08 DIAGNOSIS — M179 Osteoarthritis of knee, unspecified: Secondary | ICD-10-CM | POA: Diagnosis not present

## 2015-03-08 DIAGNOSIS — M25562 Pain in left knee: Secondary | ICD-10-CM

## 2015-03-08 DIAGNOSIS — M25662 Stiffness of left knee, not elsewhere classified: Secondary | ICD-10-CM

## 2015-03-08 NOTE — Therapy (Signed)
[facility] [address] [place], [place], [address] Phone: [phone]   Fax:  [phone]  Physical Therapy Treatment  Patient Details  Name: Tricia Baker MRN: [id number] Date of Birth: September 08, 1951 Referring Provider:  Remus LofflerJones, Angel S, PA-C  Encounter Date: 03/08/2015      PT End of Session - 03/08/15 1518    Visit Number 6   Number of Visits 12   Date for PT Re-Evaluation 03/29/15   PT Start Time 1512   PT Stop Time 1559   PT Time Calculation (min) 47 min   Activity Tolerance Patient tolerated treatment well   Behavior [facility] for tasks assessed/performed      Past Medical History  Diagnosis Date  . Psoriasis   . Reflux   . Arthritis   . Fluid retention     Past Surgical History  Procedure Laterality Date  . Appendectomy    . Abdominal hysterectomy    . Vocal cord surgery    . Shoulder surgery Right     There were no vitals filed for this visit.  Visit Diagnosis:  Left knee pain  Knee stiffness, left      Subjective Assessment - 03/08/15 1515    Symptoms Patient states that she has not taken pain medications today and she drove herself to therapy today. Patient reported that she had her next MD appt wrong and does not go until 03-14-2015. States that when she gets tired she starts getting wobbly and may needs breaks.  States she can feel swelling more in L thigh today.   Limitations Sitting   Currently in Pain? No/denies  Reports that it only feels thick at beginning of treatment.   Aggravating Factors  Sitting   Pain Relieving Factors Rest and ice            [facility] - 03/08/15 0001    Assessment   Medical Diagnosis Left Total knee arthroplasty   Onset Date 01/26/15   Next MD Visit 03-14-2015                   [facility] Treatment/Exercise - 03/08/15 0001    Exercises   Exercises Knee/Hip   Knee/Hip Exercises: Stretches   Knee: Self-Stretch to increase  Flexion 3 reps;30 seconds;Limitations   Knee: Self-Stretch Limitations on 14in step   Knee/Hip Exercises: Aerobic   Stationary Bike Nustep workload 4, seat 8 x 8 minutes   Knee/Hip Exercises: Standing   Lateral Step Up Left;3 sets;10 reps;Step Height: 6"   Forward Step Up Left;3 sets;10 reps;Step Height: 6"   Rocker Board 3 minutes  Emphasis on calf stretch   Modalities   Modalities Electrical Stimulation;Cryotherapy   Cryotherapy   Number Minutes Cryotherapy 7.5 Minutes  Only recieved 7.5 minutes of e-stim and vaso due to time   Cryotherapy Location Knee   Type of Cryotherapy Other (comment)  Vasopneumatic   Electrical Stimulation   Electrical Stimulation Location left knee   Electrical Stimulation Action IFC   Electrical Stimulation Parameters 1-10Hz    Electrical Stimulation Goals Pain;Edema   Manual Therapy   Manual Therapy Passive ROM   Passive ROM L knee PROM into flex/ext and patellar mobs                     PT Long Term Goals - 03/03/15 1603    PT LONG TERM GOAL #1   Title Ind with advanced HEP.   Time 6  Period Weeks   Status On-going   PT LONG TERM GOAL #2   Title Full left knee extension.   Time 6   Period Weeks   Status Achieved   PT LONG TERM GOAL #3   Title Active left knee flexion= 115 degrees.   Time 6   Period Weeks   Status On-going   PT LONG TERM GOAL #4   Title Perform ADL's with pain not > 3/10   Time 6   Period Weeks   Status On-going   PT LONG TERM GOAL #5   Title Patient perform a reciprocating stair gait with one railing.   Time 6   Period Weeks   Status On-going               Plan - 03/08/15 1551    Clinical Impression Statement Patient tolerated treatment well but needed a break during step ups due to fatigue. Required external focus of attention during lateral and forward step ups to increase L knee flexion and decrease L hip circumduction. Normal e-stim and cryotherapy response was observed during this  treatment. Patient reported /10 pain after the end of the session.   Rehab Potential Good   PT Frequency 3x / week   PT Duration 6 weeks   PT Treatment/Interventions Patient/family education;Passive range of motion;Gait training;Manual techniques;Cryotherapy;Electrical Stimulation;Neuromuscular re-education   PT Next Visit Plan Continue PT POC. Consider initiating stationary bike and TKE next session.        Problem List There are no active problems to display for this patient.   Evelene Croon, PTA 03/08/2015, 4:05 PM  [facility] [address] [place], [place], [address] Phone: [phone]   Fax:  [phone]

## 2015-03-10 ENCOUNTER — Ambulatory Visit: Payer: Federal, State, Local not specified - PPO | Admitting: Physical Therapy

## 2015-03-10 DIAGNOSIS — M179 Osteoarthritis of knee, unspecified: Secondary | ICD-10-CM | POA: Diagnosis not present

## 2015-03-10 DIAGNOSIS — M25662 Stiffness of left knee, not elsewhere classified: Secondary | ICD-10-CM

## 2015-03-10 DIAGNOSIS — M25562 Pain in left knee: Secondary | ICD-10-CM

## 2015-03-10 NOTE — Therapy (Signed)
[facility] [address] [place], [place], [address] Phone: [phone]   Fax:  [phone]  Physical Therapy Treatment  Patient Details  Name: Tricia Baker MRN: [id number] Date of Birth: 10-31-1951 Referring Provider:  Remus LofflerJones, Angel S, PA-C  Encounter Date: 03/10/2015      PT End of Session - 03/10/15 1553    Visit Number 7   Number of Visits 12   Date for PT Re-Evaluation 03/29/15   Activity Tolerance Patient tolerated treatment well   Behavior During Therapy [facility] for tasks assessed/performed      Past Medical History  Diagnosis Date  . Psoriasis   . Reflux   . Arthritis   . Fluid retention     Past Surgical History  Procedure Laterality Date  . Appendectomy    . Abdominal hysterectomy    . Vocal cord surgery    . Shoulder surgery Right     There were no vitals filed for this visit.  Visit Diagnosis:  Left knee pain  Knee stiffness, left      Subjective Assessment - 03/10/15 1512    Symptoms I have an MD appointment Monday.  [place].    Pain Score 4    Pain Location Knee   Pain Orientation Left   Pain Descriptors / Indicators Throbbing   Pain Type Surgical pain   Pain Onset More than a month ago   Pain Frequency Constant   Aggravating Factors  Sitting and then getting up.   Pain Relieving Factors rest and ice.   Multiple Pain Sites No            [facility] PT Assessment - 03/10/15 0001    AROM   AROM Assessment Site --  -5 degrees to 75 degrees.   PROM   Overall PROM  --  -3 degrees to 80 degrees.                   [facility]/Exercise - 03/10/15 0001    Exercises   Exercises Knee/Hip   Knee/Hip Exercises: Aerobic   Stationary Bike Stationary bike seat 4 x 15 minutes.  Patient unable to make a revolution.   Manual Therapy   Manual Therapy Passive ROM  Left knee PROM x 10 minutes.                     PT Long Term  Goals - 03/10/15 1552    PT LONG TERM GOAL #1   Title Ind with advanced HEP.   Time 6   Period Weeks   Status On-going   PT LONG TERM GOAL #2   Title Full left knee extension.   Time 6   Period Weeks   Status Achieved   PT LONG TERM GOAL #3   Title Active left knee flexion= 115 degrees.   Time 6   Period Weeks   Status On-going   PT LONG TERM GOAL #4   Title Perform ADL's with pain not > 3/10   Time 6   Period Weeks   Status On-going               Problem List There are no active problems to display for this patient.   Shawnie Nicole, [name] 03/10/2015, 4:01 PM  [facility] [address] [place], [place], [address] Phone: [phone]   Fax:  [phone]

## 2015-03-14 ENCOUNTER — Ambulatory Visit: Payer: Federal, State, Local not specified - PPO | Admitting: Physical Therapy

## 2015-03-14 DIAGNOSIS — M25662 Stiffness of left knee, not elsewhere classified: Secondary | ICD-10-CM

## 2015-03-14 DIAGNOSIS — M25562 Pain in left knee: Secondary | ICD-10-CM

## 2015-03-14 DIAGNOSIS — M179 Osteoarthritis of knee, unspecified: Secondary | ICD-10-CM | POA: Diagnosis not present

## 2015-03-14 NOTE — Therapy (Signed)
[facility] [address] [place], [place], [address] Phone: [phone]   Fax:  [phone]  Physical Therapy Treatment  Patient Details  Name: Tricia Baker MRN: [id number] Date of Birth: 1951/05/27 Referring Provider:  Remus LofflerJones, Angel S, PA-C  Encounter Date: 03/14/2015      PT End of Session - 03/14/15 1518    Visit Number 8   Number of Visits 12   Date for PT Re-Evaluation 03/29/15   PT Start Time 0315   PT Stop Time 0422   PT Time Calculation (min) 67 min      Past Medical History  Diagnosis Date  . Psoriasis   . Reflux   . Arthritis   . Fluid retention     Past Surgical History  Procedure Laterality Date  . Appendectomy    . Abdominal hysterectomy    . Vocal cord surgery    . Shoulder surgery Right     There were no vitals filed for this visit.  Visit Diagnosis:  Left knee pain  Knee stiffness, left      Subjective Assessment - 03/14/15 1517    Symptoms Stepped off step and bent knee back.  Took my breath away.   Pain Score 2    Pain Location Knee   Pain Descriptors / Indicators Throbbing   Pain Type Surgical pain   Pain Onset More than a month ago   Pain Frequency Constant   Multiple Pain Sites No                       [facility] Adult PT Treatment/Exercise - 03/14/15 0001    Knee/Hip Exercises: Aerobic   Stationary Bike Nustep beginning at seat 6 and moving forward to seat 4 over 15 minutes followed by stationary bike seat 4 for flexion stretching x 15 minutes   Cryotherapy   Number Minutes Cryotherapy 20 Minutes   Cryotherapy Location --  left knee   Electrical Stimulation   Electrical Stimulation Action IFC x 20 minutes   Manual Therapy   Manual Therapy --  PROM to left knee x 8 minutes.                     PT Long Term Goals - 03/10/15 1552    PT LONG TERM GOAL #1   Title Ind with advanced HEP.   Time 6   Period Weeks   Status On-going   PT LONG TERM GOAL #2    Title Full left knee extension.   Time 6   Period Weeks   Status Achieved   PT LONG TERM GOAL #3   Title Active left knee flexion= 115 degrees.   Time 6   Period Weeks   Status On-going   PT LONG TERM GOAL #4   Title Perform ADL's with pain not > 3/10   Time 6   Period Weeks   Status On-going               Plan - 03/14/15 1601    PT Next Visit Plan Focus on ROM.  Nustep followed by bike followed by PROM.        Problem List There are no active problems to display for this patient.   APPLEGATE, [name] 03/14/2015, 4:23 PM  [facility] [address] [place], [place], [address] Phone: [phone]   Fax:  [phone]

## 2015-03-17 ENCOUNTER — Ambulatory Visit: Payer: Federal, State, Local not specified - PPO | Admitting: Physical Therapy

## 2015-03-17 DIAGNOSIS — M25662 Stiffness of left knee, not elsewhere classified: Secondary | ICD-10-CM

## 2015-03-17 DIAGNOSIS — M25562 Pain in left knee: Secondary | ICD-10-CM

## 2015-03-17 DIAGNOSIS — M179 Osteoarthritis of knee, unspecified: Secondary | ICD-10-CM | POA: Diagnosis not present

## 2015-03-17 NOTE — Therapy (Signed)
[facility] [address] [place], [place], [address] Phone: [phone]   Fax:  [phone]  Physical Therapy Treatment  Patient Details  Name: Tricia Baker MRN: [id number] Date of Birth: [birth year] Referring Provider:  Remus LofflerJones, Tricia S, PA-C  Encounter Date: 03/17/2015      PT End of Session - 03/17/15 1421    Visit Number 9   Number of Visits 12   PT Start Time 0204   PT Stop Time 0309   PT Time Calculation (min) 65 min   Activity Tolerance Patient tolerated treatment well   Behavior During Therapy [name]      Past Medical History  Diagnosis Date  . Psoriasis   . Reflux   . Arthritis   . Fluid retention     Past Surgical History  Procedure Laterality Date  . Appendectomy    . Abdominal hysterectomy    . Vocal cord surgery    . Shoulder surgery Right     There were no vitals filed for this visit.  Visit Diagnosis:  Left knee pain  Knee stiffness, left      Subjective Assessment - 03/17/15 1420    Symptoms Did a lot of housework today.   Pain Score 1    Pain Location Knee   Pain Orientation Left   Pain Descriptors / Indicators Aching   Pain Type Surgical pain   Pain Onset More than a month ago   Pain Frequency Constant                       [facility] Adult PT Treatment/Exercise - 03/17/15 0001    Exercises   Exercises Knee/Hip   Knee/Hip Exercises: Aerobic   Stationary Bike Nustep x 15 minutes moving forward x 2 to increase flexion f/b stationary bike on seat 4 x 17 minutes.   Manual Therapy   Manual Therapy --  Left knee PROM into flexion x 8 minutes.                     PT Long Term Goals - 03/10/15 1552    PT LONG TERM GOAL #1   Title Ind with advanced HEP.   Time 6   Period Weeks   Status On-going   PT LONG TERM GOAL #2   Title Full left knee extension.   Time 6   Period Weeks   Status Achieved   PT LONG TERM GOAL #3   Title Active  left knee flexion= 115 degrees.   Time 6   Period Weeks   Status On-going   PT LONG TERM GOAL #4   Title Perform ADL's with pain not > 3/10   Time 6   Period Weeks   Status On-going               Plan - 03/17/15 1452    Rehab Potential Good   PT Frequency 3x / week   PT Duration 6 weeks   PT Treatment/Interventions Patient/family education;Passive range of motion;Gait training;Manual techniques;Cryotherapy;Electrical Stimulation;Neuromuscular re-education   PT Next Visit Plan Focus on ROM.  Nustep followed by bike followed by PROM.   Consulted and Agree with Plan of Care Patient        Problem List There are no active problems to display for this patient.   Tricia Baker, [place] Baker 03/17/2015, 3:10 PM  [facility] [address] [place], [place], [address] Phone: [phone]   Fax:  [phone]      

## 2015-03-22 ENCOUNTER — Encounter: Payer: Self-pay | Admitting: Physical Therapy

## 2015-03-22 ENCOUNTER — Ambulatory Visit: Payer: Federal, State, Local not specified - PPO | Attending: Student | Admitting: Physical Therapy

## 2015-03-22 DIAGNOSIS — M25562 Pain in left knee: Secondary | ICD-10-CM | POA: Diagnosis not present

## 2015-03-22 DIAGNOSIS — M179 Osteoarthritis of knee, unspecified: Secondary | ICD-10-CM | POA: Insufficient documentation

## 2015-03-22 DIAGNOSIS — M25662 Stiffness of left knee, not elsewhere classified: Secondary | ICD-10-CM

## 2015-03-22 NOTE — Therapy (Signed)
[facility] [address] [place], [place], [address] Phone: [phone]   Fax:  [phone]  Physical Therapy Treatment  Patient Details  Name: Tricia Baker MRN: [id number] Date of Birth: Jun 30, 1951 Referring Provider:  Remus Loffler, PA-C  Encounter Date: 03/22/2015      PT End of Session - 03/22/15 1350    Visit Number 10   Number of Visits 12   Date for PT Re-Evaluation 03/29/15   PT Start Time 1345   PT Stop Time 1453   PT Time Calculation (min) 68 min   Activity Tolerance Patient tolerated treatment well   Behavior During Therapy [facility] for tasks assessed/performed      Past Medical History  Diagnosis Date  . Psoriasis   . Reflux   . Arthritis   . Fluid retention     Past Surgical History  Procedure Laterality Date  . Appendectomy    . Abdominal hysterectomy    . Vocal cord surgery    . Shoulder surgery Right     There were no vitals filed for this visit.  Visit Diagnosis:  Left knee pain  Knee stiffness, left      Subjective Assessment - 03/22/15 1348    Subjective States that she overdid work yesterday in cutting back roses and carting the trimmings away and is sore. Has gotten her dad's stationary bike and states that the back of her calf was really close to the back of her thigh. States that it is not as swollen on the posterior side of her knee and the knot on the lateral side of L knee has decreased considerably.    How long can you sit comfortably? Dining room chair- 15 min, vanity chair- 30 min    How long can you stand comfortably? 15 minutes   How long can you walk comfortably? 30 minutes   Currently in Pain? Yes   Pain Score 2    Pain Location Knee   Pain Orientation Left   Pain Descriptors / Indicators Throbbing   Pain Type Surgical pain   Pain Onset More than a month ago            [facility] PT Assessment - 03/22/15 0001    Assessment   Medical Diagnosis Left Total knee arthroplasty   Onset Date 01/26/15   Next MD Visit 03/28/2015                   [facility] Adult PT Treatment/Exercise - 03/22/15 0001    Knee/Hip Exercises: Aerobic   Stationary Bike Seat 2 x 15 minutes for ROM  Made 20 complete revolutions    Elliptical NuStep L5, seat advanced periodically to increase knee flexion x 15 minutes   Modalities   Modalities Cryotherapy   Cryotherapy   Number Minutes Cryotherapy 15 Minutes   Cryotherapy Location Knee   Type of Cryotherapy Other (comment)  Vasopneumatic   Electrical Stimulation   Electrical Stimulation Location left knee   Electrical Stimulation Action IFC   Electrical Stimulation Parameters 1-10 Hz   Electrical Stimulation Goals Pain;Edema   Manual Therapy   Manual Therapy Passive ROM   Passive ROM L knee into flexion in sitting                      PT Long Term Goals - 03/22/15 1350    PT LONG TERM GOAL #1   Title Ind with advanced HEP.   Time 6   Period Weeks  Status On-going   PT LONG TERM GOAL #2   Title Full left knee extension.   Time 6   Period Weeks   Status Achieved   PT LONG TERM GOAL #3   Title Active left knee flexion= 115 degrees.   Time 6   Period Weeks   Status On-going   PT LONG TERM GOAL #4   Title Perform ADL's with pain not > 3/10   Time 6   Period Weeks   Status Achieved   PT LONG TERM GOAL #5   Title Patient perform a reciprocating stair gait with one railing.   Time 6   Period Weeks   Status Achieved               Plan - 03/22/15 1437    Clinical Impression Statement Patient tolerated treatment well today with the emphasis on ROM. Had some throbbing during treatment which patient said it felt like it was swelling again with the activity. Normal cryotherapy and e-stim response observed following the removal of modalities. Patient denied pain following the treatment. Achieved goals #5 (complete ADLs with pain less than 3/10) and goal #6 (reciprical step gait with one railing).    Rehab Potential Good   PT Frequency 3x / week   PT Duration 6 weeks   PT Treatment/Interventions Patient/family education;Passive range of motion;Gait training;Manual techniques;Cryotherapy;Electrical Stimulation;Neuromuscular re-education   PT Next Visit Plan Continue with NuStep, stationary bike, PROM treatments. Reassess ROM next treatment for MD appt 03/28/2015   Consulted and Agree with Plan of Care Patient        Problem List There are no active problems to display for this patient.   Evelene CroonKelsey M Parsons, PTA 03/22/2015, 2:56 PM  [facility] [address] [place], [place], [address] Phone: [phone]   Fax:  [phone]

## 2015-03-24 ENCOUNTER — Ambulatory Visit: Payer: Federal, State, Local not specified - PPO | Admitting: *Deleted

## 2015-03-24 ENCOUNTER — Encounter: Payer: Self-pay | Admitting: *Deleted

## 2015-03-24 DIAGNOSIS — M179 Osteoarthritis of knee, unspecified: Secondary | ICD-10-CM | POA: Diagnosis not present

## 2015-03-24 DIAGNOSIS — M25562 Pain in left knee: Secondary | ICD-10-CM

## 2015-03-24 DIAGNOSIS — M25662 Stiffness of left knee, not elsewhere classified: Secondary | ICD-10-CM

## 2015-03-24 NOTE — Therapy (Signed)
[facility] [address] [place], [place], [address] Phone: [phone]   Fax:  [phone]  Physical Therapy Treatment  Patient Details  Name: Tricia Baker MRN: [id number] Date of Birth: 01/13/1951 Referring Provider:  Remus Loffler, PA-C  Encounter Date: 03/24/2015    Past Medical History  Diagnosis Date  . Psoriasis   . Reflux   . Arthritis   . Fluid retention     Past Surgical History  Procedure Laterality Date  . Appendectomy    . Abdominal hysterectomy    . Vocal cord surgery    . Shoulder surgery Right     There were no vitals filed for this visit.  Visit Diagnosis:  Left knee pain  Knee stiffness, left      Subjective Assessment - 03/24/15 1353    Subjective States that she overdid work yesterday in cutting back roses and carting the trimmings away and is sore. Has gotten her dad's stationary bike and states that the back of her calf was really close to the back of her thigh. States that it is not as swollen on the posterior side of her knee and the knot on the lateral side of L knee has decreased considerably.    Limitations Sitting   How long can you sit comfortably? Dining room chair- 15 min, vanity chair- 30 min    How long can you stand comfortably? 15 minutes   How long can you walk comfortably? 30 minutes   Currently in Pain? Yes   Pain Score 2    Pain Location Knee   Pain Orientation Left   Pain Descriptors / Indicators Throbbing   Pain Type Surgical pain   Pain Onset More than a month ago   Pain Frequency Constant   Aggravating Factors  sitting/ getting up   Pain Relieving Factors rest/ice            [facility] PT Assessment - 03/24/15 0001    AROM   Overall AROM  Deficits   AROM Assessment Site Knee   PROM   Overall PROM  Deficits   Overall PROM Comments -3 to 105 degrees left knee                   [facility] Adult PT Treatment/Exercise - 03/24/15 0001    Exercises   Exercises  Knee/Hip   Knee/Hip Exercises: Aerobic   Stationary Bike Seat 2 x 10 minutes for ROM   Elliptical NuStep L5, seat advanced periodically to increase knee flexion x 10 minutes   Cryotherapy   Number Minutes Cryotherapy 15 Minutes   Cryotherapy Location Knee   Type of Cryotherapy Ice pack  Vasopnuematic   Electrical Stimulation   Electrical Stimulation Location left knee   Electrical Stimulation Action IFC   Electrical Stimulation Parameters 1-10 hz   Electrical Stimulation Goals Pain;Edema   Manual Therapy   Manual Therapy Passive ROM   Massage scar mobs IASTM around incision   Passive ROM L knee into flexion in sitting                      PT Long Term Goals - 03/22/15 1350    PT LONG TERM GOAL #1   Title Ind with advanced HEP.   Time 6   Period Weeks   Status On-going   PT LONG TERM GOAL #2   Title Full left knee extension.   Time 6   Period Weeks   Status Achieved  PT LONG TERM GOAL #3   Title Active left knee flexion= 115 degrees.   Time 6   Period Weeks   Status On-going   PT LONG TERM GOAL #4   Title Perform ADL's with pain not > 3/10   Time 6   Period Weeks   Status Achieved   PT LONG TERM GOAL #5   Title Patient perform a reciprocating stair gait with one railing.   Time 6   Period Weeks   Status Achieved               Plan - 03/24/15 1435    Clinical Impression Statement Pt did better with ROM today to 105 degrees PROM LT knee   Rehab Potential Good   PT Frequency 3x / week   PT Duration 6 weeks   PT Treatment/Interventions Therapeutic exercise   PT Next Visit Plan  Continue as per MD. TO MD on 03-28-15   Consulted and Agree with Plan of Care Patient        Problem List There are no active problems to display for this patient. Chris Ramseur, PTA 03/24/2015 2:40 PM  RAMSEUR,CHRIS, PTA 03/24/2015, 2:40 PM  [facility] [address] [place], [place], [address] Phone: [phone]    Fax:  [phone]

## 2015-03-29 ENCOUNTER — Ambulatory Visit: Payer: Federal, State, Local not specified - PPO | Admitting: Physical Therapy

## 2015-03-29 ENCOUNTER — Encounter: Payer: Self-pay | Admitting: Physical Therapy

## 2015-03-29 DIAGNOSIS — M179 Osteoarthritis of knee, unspecified: Secondary | ICD-10-CM | POA: Diagnosis not present

## 2015-03-29 DIAGNOSIS — M25562 Pain in left knee: Secondary | ICD-10-CM

## 2015-03-29 DIAGNOSIS — M25662 Stiffness of left knee, not elsewhere classified: Secondary | ICD-10-CM

## 2015-03-29 NOTE — Therapy (Signed)
[facility] [address] [place], [place], [address] Phone: [phone]   Fax:  [phone]  Physical Therapy Treatment  Patient Details  Name: Tricia Baker MRN: [id number] Date of Birth: 1951/11/17 Referring Provider:  Remus Loffler, PA-C  Encounter Date: 03/29/2015      PT End of Session - 03/29/15 1359    Visit Number 11   Number of Visits 12   Date for PT Re-Evaluation 03/29/15   PT Start Time 1346   PT Stop Time 1438   PT Time Calculation (min) 52 min   Activity Tolerance Patient tolerated treatment well   Behavior During Therapy [facility] for tasks assessed/performed      Past Medical History  Diagnosis Date  . Psoriasis   . Reflux   . Arthritis   . Fluid retention     Past Surgical History  Procedure Laterality Date  . Appendectomy    . Abdominal hysterectomy    . Vocal cord surgery    . Shoulder surgery Right     There were no vitals filed for this visit.  Visit Diagnosis:  Left knee pain  Knee stiffness, left      Subjective Assessment - 03/29/15 1403    Subjective MD said he does not need to manipulate L knee at this time but patient has to call him next Wednesday (03/24/2015) to report ROM measurements as to whether he needs to consider manipulation again. Had some pain and stiffness over the weekend which patient attributes to cold weather.   Limitations Sitting   How long can you sit comfortably? Dining room chair- 15 min, vanity chair- 30 min    How long can you stand comfortably? 15 minutes   How long can you walk comfortably? 30 minutes   Currently in Pain? Yes   Pain Score 3    Pain Location Knee   Pain Orientation Left   Pain Descriptors / Indicators Other (Comment)  Stiff and thick   Pain Type Surgical pain   Pain Onset More than a month ago   Pain Frequency Intermittent            [facility] PT Assessment - 03/29/15 0001    Assessment   Medical Diagnosis Left Total knee arthroplasty   Onset Date 01/26/15   Next MD Visit --  Requests to call MD 04/06/2015 for ROM update   AROM   Overall AROM  Deficits   AROM Assessment Site Knee   Right/Left Knee Left   Left Knee Flexion 108                   [facility] Adult PT Treatment/Exercise - 03/29/15 0001    Knee/Hip Exercises: Aerobic   Stationary Bike x 10 minutes for ROM   Elliptical NuStep x15 minutes, seat advanced periodically to increase L knee flexion   Modalities   Modalities Cryotherapy;Electrical Stimulation   Cryotherapy   Number Minutes Cryotherapy 15 Minutes   Cryotherapy Location Knee   Type of Cryotherapy Other (comment)  Vasopneumatic   Electrical Stimulation   Electrical Stimulation Location left knee   Electrical Stimulation Action IFC   Electrical Stimulation Parameters 1-10Hz    Electrical Stimulation Goals Pain;Edema   Manual Therapy   Manual Therapy Passive ROM   Passive ROM L knee into flexion in sitting                      PT Long Term Goals - 03/29/15 1400  PT LONG TERM GOAL #1   Title Ind with advanced HEP.   Time 6   Period Weeks   Status Achieved   PT LONG TERM GOAL #2   Title Full left knee extension.   Time 6   Period Weeks   Status Achieved   PT LONG TERM GOAL #3   Title Active left knee flexion= 115 degrees.   Time 6   Period Weeks   Status On-going  03/29/2015 L knee AROM flexion 108 deg   PT LONG TERM GOAL #4   Title Perform ADL's with pain not > 3/10   Time 6   Period Weeks   Status Achieved   PT LONG TERM GOAL #5   Title Patient perform a reciprocating stair gait with one railing.   Time 6   Period Weeks   Status Achieved               Plan - 03/29/15 1426    Clinical Impression Statement Patient continues to tolerate treatments well and to progress with ROM measurements. Normal cryotherapy and e-stim response noted following the removal of the modalities. Had indention on L shin area to due to vasopneumatic machine appear following  removal of system.    Rehab Potential Good   PT Frequency 3x / week   PT Duration 6 weeks   PT Treatment/Interventions Therapeutic exercise   PT Next Visit Plan Continue to emphasize ROM per PT POC. Patient must notify PT of progress 04/06/2015   Consulted and Agree with Plan of Care Patient        Problem List There are no active problems to display for this patient.   Evelene CroonKelsey M Parsons, PTA 03/29/2015, 3:07 PM  [facility] [address] [place], [place], [address] Phone: [phone]   Fax:  [phone]

## 2015-03-30 NOTE — Addendum Note (Signed)
Addended by: Lexee Brashears, Italy[place]AD W on: 03/30/2015 08:49 AM   Modules accepted: Orders

## 2015-03-31 ENCOUNTER — Ambulatory Visit: Payer: Federal, State, Local not specified - PPO | Admitting: Physical Therapy

## 2015-03-31 DIAGNOSIS — M179 Osteoarthritis of knee, unspecified: Secondary | ICD-10-CM | POA: Diagnosis not present

## 2015-03-31 DIAGNOSIS — M25662 Stiffness of left knee, not elsewhere classified: Secondary | ICD-10-CM

## 2015-03-31 DIAGNOSIS — M25562 Pain in left knee: Secondary | ICD-10-CM

## 2015-03-31 NOTE — Therapy (Signed)
[facility] [address] [place], [place], [address] Phone: [phone]   Fax:  [phone]  Physical Therapy Treatment  Patient Details  Name: Tricia Baker MRN: [id number] Date of Birth: December 05, 1951 Referring Provider:  Remus LofflerJones, Angel S, PA-C  Encounter Date: 03/31/2015      PT End of Session - 03/31/15 1411    Visit Number 12   Number of Visits 12   Date for PT Re-Evaluation 04/28/15   PT Start Time 0145   Activity Tolerance Patient tolerated treatment well   Behavior During Therapy [facility] for tasks assessed/performed      Past Medical History  Diagnosis Date  . Psoriasis   . Reflux   . Arthritis   . Fluid retention     Past Surgical History  Procedure Laterality Date  . Appendectomy    . Abdominal hysterectomy    . Vocal cord surgery    . Shoulder surgery Right     There were no vitals filed for this visit.  Visit Diagnosis:  Left knee pain  Knee stiffness, left      Subjective Assessment - 03/31/15 1351    Subjective Pleased with ho I have been doing.   Pain Score 3    Pain Location Knee   Pain Orientation Left   Pain Descriptors / Indicators --  Feels thick.                                    PT Long Term Goals - 03/29/15 1400    PT LONG TERM GOAL #1   Title Ind with advanced HEP.   Time 6   Period Weeks   Status Achieved   PT LONG TERM GOAL #2   Title Full left knee extension.   Time 6   Period Weeks   Status Achieved   PT LONG TERM GOAL #3   Title Active left knee flexion= 115 degrees.   Time 6   Period Weeks   Status On-going  03/29/2015 L knee AROM flexion 108 deg   PT LONG TERM GOAL #4   Title Perform ADL's with pain not > 3/10   Time 6   Period Weeks   Status Achieved   PT LONG TERM GOAL #5   Title Patient perform a reciprocating stair gait with one railing.   Time 6   Period Weeks   Status Achieved      Treatment:  THERE EX:  Nustep with close  seat x 15 minutes  Stationary bike x 15 minutes at seat 4 (100+ revolutions).  MANUAL:  PROM x 8 minutes  IFC and medium vasopneumatic to patient's left knee x 15 minutes.           Problem List There are no active problems to display for this patient.   Tiombe Tomeo, [name] 03/31/2015, 2:54 PM  [facility] [address] [place], [place], [address] Phone: [phone]   Fax:  [phone]

## 2015-04-05 ENCOUNTER — Ambulatory Visit: Payer: Federal, State, Local not specified - PPO | Admitting: *Deleted

## 2015-04-05 ENCOUNTER — Encounter: Payer: Self-pay | Admitting: *Deleted

## 2015-04-05 DIAGNOSIS — M179 Osteoarthritis of knee, unspecified: Secondary | ICD-10-CM | POA: Diagnosis not present

## 2015-04-05 DIAGNOSIS — M25662 Stiffness of left knee, not elsewhere classified: Secondary | ICD-10-CM

## 2015-04-05 DIAGNOSIS — M25562 Pain in left knee: Secondary | ICD-10-CM

## 2015-04-05 NOTE — Therapy (Signed)
[facility] [address] [place], [place], [address] Phone: [phone]   Fax:  [phone]  Physical Therapy Treatment  Patient Details  Name: Tricia Baker MRN: [id number] Date of Birth: Oct 31, 1951 Referring Provider:  Remus LofflerJones, Angel S, PA-C  Encounter Date: 04/05/2015      PT End of Session - 04/05/15 1407    Visit Number 13   Number of Visits 13   Date for PT Re-Evaluation 04/28/15   PT Start Time 1345   PT Stop Time 1435   PT Time Calculation (min) 50 min      Past Medical History  Diagnosis Date  . Psoriasis   . Reflux   . Arthritis   . Fluid retention     Past Surgical History  Procedure Laterality Date  . Appendectomy    . Abdominal hysterectomy    . Vocal cord surgery    . Shoulder surgery Right     There were no vitals filed for this visit.  Visit Diagnosis:  Left knee pain  Knee stiffness, left      Subjective Assessment - 04/05/15 1357    Subjective Doing ok. Want  more bending in my LT knee. Over did it in the garden yesterday   Limitations Sitting   How long can you sit comfortably? Dining room chair- 15 min, vanity chair- 30 min    How long can you stand comfortably? 15 minutes   How long can you walk comfortably? 30 minutes                         [facility] Adult PT Treatment/Exercise - 04/05/15 0001    Exercises   Exercises Knee/Hip   Knee/Hip Exercises: Aerobic   Stationary Bike x 10 minutes for ROM   Elliptical NuStep x10 minutes, seat advanced periodically to increase L knee flexion   Cryotherapy   Number Minutes Cryotherapy 15 Minutes   Cryotherapy Location Knee   Type of Cryotherapy --  Vasopnuematic   Electrical Stimulation   Electrical Stimulation Location left knee   Electrical Stimulation Action IFC   Electrical Stimulation Parameters 1-10hz    Manual Therapy   Manual Therapy Passive ROM   Massage scar mobs IASTM around incision and knee joint   Passive ROM L  knee into flexion in sitting , contrct/relax                     PT Long Term Goals - 03/29/15 1400    PT LONG TERM GOAL #1   Title Ind with advanced HEP.   Time 6   Period Weeks   Status Achieved   PT LONG TERM GOAL #2   Title Full left knee extension.   Time 6   Period Weeks   Status Achieved   PT LONG TERM GOAL #3   Title Active left knee flexion= 115 degrees.   Time 6   Period Weeks   Status On-going  03/29/2015 L knee AROM flexion 108 deg   PT LONG TERM GOAL #4   Title Perform ADL's with pain not > 3/10   Time 6   Period Weeks   Status Achieved   PT LONG TERM GOAL #5   Title Patient perform a reciprocating stair gait with one railing.   Time 6   Period Weeks   Status Achieved               Plan - 04/05/15 1408  Clinical Impression Statement Pt did good with Rx today. Did well with contract/relax stretching   Rehab Potential Good   PT Treatment/Interventions Therapeutic exercise   PT Next Visit Plan Continue to emphasize ROM per PT POC. Patient must notify PT of progress 04/06/2015        Problem List There are no active problems to display for this patient.   Tricia Baker,CHRIS, PTA 04/05/2015, 2:41 PM  [facility] [address] [place], [place], [address] Phone: [phone]   Fax:  [phone]

## 2015-04-06 ENCOUNTER — Ambulatory Visit: Payer: Federal, State, Local not specified - PPO | Admitting: Physical Therapy

## 2015-04-06 ENCOUNTER — Encounter: Payer: Self-pay | Admitting: Physical Therapy

## 2015-04-06 DIAGNOSIS — M25662 Stiffness of left knee, not elsewhere classified: Secondary | ICD-10-CM

## 2015-04-06 DIAGNOSIS — M25562 Pain in left knee: Secondary | ICD-10-CM

## 2015-04-06 DIAGNOSIS — M179 Osteoarthritis of knee, unspecified: Secondary | ICD-10-CM | POA: Diagnosis not present

## 2015-04-06 NOTE — Therapy (Signed)
[facility] [place], [place], [address] Phone: [phone]   Fax:  [phone]  Physical Therapy Treatment  Patient Details  Name: Tricia Baker MRN: [id number] Date of Birth: 1951-11-14 Referring Provider:  Terald Sleeper, PA-C  Encounter Date: 04/06/2015      PT End of Session - 04/06/15 1110    Visit Number 14   Number of Visits 13   Date for PT Re-Evaluation 04/28/15   PT Start Time 1030   PT Stop Time 1128   PT Time Calculation (min) 58 min   Activity Tolerance Patient tolerated treatment well   Behavior During Therapy [facility] for tasks assessed/performed      Past Medical History  Diagnosis Date  . Psoriasis   . Reflux   . Arthritis   . Fluid retention     Past Surgical History  Procedure Laterality Date  . Appendectomy    . Abdominal hysterectomy    . Vocal cord surgery    . Shoulder surgery Right     There were no vitals filed for this visit.  Visit Diagnosis:  Left knee pain  Knee stiffness, left      Subjective Assessment - 04/06/15 1043    Subjective stiff today   Limitations Sitting   How long can you sit comfortably? Dining room chair- 15 min, vanity chair- 30 min    How long can you stand comfortably? 15 minutes   How long can you walk comfortably? 30 minutes   Currently in Pain? Yes   Pain Score 2    Pain Location Knee   Pain Orientation Left   Pain Descriptors / Indicators Aching   Pain Type Surgical pain   Pain Onset More than a month ago   Pain Frequency Intermittent   Aggravating Factors  standing in one spot   Pain Relieving Factors rest            [facility] PT Assessment - 04/06/15 0001    ROM / Strength   AROM / PROM / Strength AROM;PROM   AROM   Overall AROM  Deficits   AROM Assessment Site Knee   Right/Left Knee Left   Left Knee Flexion 96   PROM   Overall PROM  Deficits   PROM Assessment Site Knee   Right/Left Knee Left   Left Knee Flexion 108                      [facility] Adult PT Treatment/Exercise - 04/06/15 0001    Knee/Hip Exercises: Stretches   Knee: Self-Stretch to increase Flexion 3 reps;30 seconds;Limitations   Knee/Hip Exercises: Aerobic   Stationary Bike x 5 min   Elliptical NuStep x10 minutes, seat advanced periodically to increase L knee flexion   Knee/Hip Exercises: Standing   Forward Step Up Left;3 sets;10 reps;Step Height: 6"   Cryotherapy   Number Minutes Cryotherapy 15 Minutes   Cryotherapy Location Knee   Type of Cryotherapy --  vasopnumatic    Electrical Stimulation   Electrical Stimulation Location left knee   Electrical Stimulation Action IFC   Electrical Stimulation Parameters 1-10HZ    Electrical Stimulation Goals Pain;Edema   Manual Therapy   Manual Therapy Passive ROM   Passive ROM low load holds for flexion                     PT Long Term Goals - 03/29/15 1400    PT LONG TERM GOAL #1   Title  Ind with advanced HEP.   Time 6   Period Weeks   Status Achieved   PT LONG TERM GOAL #2   Title Full left knee extension.   Time 6   Period Weeks   Status Achieved   PT LONG TERM GOAL #3   Title Active left knee flexion= 115 degrees.   Time 6   Period Weeks   Status On-going  03/29/2015 L knee AROM flexion 108 deg   PT LONG TERM GOAL #4   Title Perform ADL's with pain not > 3/10   Time 6   Period Weeks   Status Achieved   PT LONG TERM GOAL #5   Title Patient perform a reciprocating stair gait with one railing.   Time 6   Period Weeks   Status Achieved               Plan - 04/06/15 1112    Clinical Impression Statement patient tolerated tx well today. Has met all goals except knee flexion. patient understands importance of self ROM and HEP activities.    Rehab Potential Good   PT Frequency 3x / week   PT Duration 6 weeks   PT Treatment/Interventions Therapeutic exercise   PT Next Visit Plan Patient is on hold for order renewal    Consulted and Agree with  Plan of Care Patient        Problem List There are no active problems to display for this patient.   Phillips Climes, PTA 04/06/2015, 11:28 AM  [facility] [address] [place], [place], [address] Phone: [phone]   Fax:  [phone]

## 2015-04-07 ENCOUNTER — Encounter: Payer: Federal, State, Local not specified - PPO | Admitting: *Deleted

## 2015-04-12 ENCOUNTER — Encounter: Payer: Self-pay | Admitting: *Deleted

## 2015-04-12 ENCOUNTER — Ambulatory Visit: Payer: Federal, State, Local not specified - PPO | Admitting: *Deleted

## 2015-04-12 DIAGNOSIS — M179 Osteoarthritis of knee, unspecified: Secondary | ICD-10-CM | POA: Diagnosis not present

## 2015-04-12 DIAGNOSIS — M25562 Pain in left knee: Secondary | ICD-10-CM

## 2015-04-12 DIAGNOSIS — M25662 Stiffness of left knee, not elsewhere classified: Secondary | ICD-10-CM

## 2015-04-12 NOTE — Therapy (Signed)
[facility] [address] [place], [place], [address] Phone: [phone]   Fax:  [phone]  Physical Therapy Treatment  Patient Details  Name: Tricia Baker MRN: [id number] Date of Birth: 1951-09-12 Referring Provider:  Remus LofflerJones, Angel S, PA-C  Encounter Date: 04/12/2015      PT End of Session - 04/12/15 1405    Visit Number 15   Number of Visits 24   Date for PT Re-Evaluation 05/12/15   PT Start Time 1345   PT Stop Time 1434   PT Time Calculation (min) 49 min      Past Medical History  Diagnosis Date  . Psoriasis   . Reflux   . Arthritis   . Fluid retention     Past Surgical History  Procedure Laterality Date  . Appendectomy    . Abdominal hysterectomy    . Vocal cord surgery    . Shoulder surgery Right     There were no vitals filed for this visit.  Visit Diagnosis:  Left knee pain  Knee stiffness, left                       [facility] Adult PT Treatment/Exercise - 04/12/15 0001    Knee/Hip Exercises: Aerobic   Stationary Bike x 10 min   Elliptical NuStep x10 minutes, seat advanced periodically to increase L knee flexion   Knee/Hip Exercises: Standing   Forward Step Up Left;3 sets;10 reps;Step Height: 6"   Manual Therapy   Manual Therapy Passive ROM   Massage scar mobs IASTM around incision and knee joint   Passive ROM low load holds for flexion                     PT Long Term Goals - 03/29/15 1400    PT LONG TERM GOAL #1   Title Ind with advanced HEP.   Time 6   Period Weeks   Status Achieved   PT LONG TERM GOAL #2   Title Full left knee extension.   Time 6   Period Weeks   Status Achieved   PT LONG TERM GOAL #3   Title Active left knee flexion= 115 degrees.   Time 6   Period Weeks   Status On-going  03/29/2015 L knee AROM flexion 108 deg   PT LONG TERM GOAL #4   Title Perform ADL's with pain not > 3/10   Time 6   Period Weeks   Status Achieved   PT LONG TERM  GOAL #5   Title Patient perform a reciprocating stair gait with one railing.   Time 6   Period Weeks   Status Achieved               Plan - 04/12/15 1438    Clinical Impression Statement Pt did great with Rx, but still has some tightness around incision with knee flexion. New Order to cont PT for knee flexion   Rehab Potential Good   PT Frequency 3x / week   PT Duration 6 weeks   PT Treatment/Interventions Therapeutic exercise   PT Next Visit Plan N.O. for Pt to cont PT 2xwk/ 4 wks   Consulted and Agree with Plan of Care Patient        Problem List There are no active problems to display for this patient.   Reuel Lamadrid,CHRIS, PTA 04/12/2015, 2:49 PM  [facility] [address] [place], [place], [address] Phone: [phone]  Fax:  [phone]

## 2015-04-14 ENCOUNTER — Encounter: Payer: Self-pay | Admitting: *Deleted

## 2015-04-14 ENCOUNTER — Ambulatory Visit: Payer: Federal, State, Local not specified - PPO | Admitting: *Deleted

## 2015-04-14 DIAGNOSIS — M25562 Pain in left knee: Secondary | ICD-10-CM

## 2015-04-14 DIAGNOSIS — M25662 Stiffness of left knee, not elsewhere classified: Secondary | ICD-10-CM

## 2015-04-14 DIAGNOSIS — M179 Osteoarthritis of knee, unspecified: Secondary | ICD-10-CM | POA: Diagnosis not present

## 2015-04-14 NOTE — Therapy (Signed)
[facility] [address] [place], [place], [address] Phone: [phone]   Fax:  [phone]  Physical Therapy Treatment  Patient Details  Name: Tricia LawmanSara S Baker MRN: [id number] Date of Birth: 10-27-1951 Referring Provider:  Remus LofflerJones, Angel S, PA-C  Encounter Date: 04/14/2015      PT End of Session - 04/14/15 1401    Visit Number 16   Number of Visits 24   Date for PT Re-Evaluation 05/12/15   PT Start Time 1345   PT Stop Time 1433   PT Time Calculation (min) 48 min      Past Medical History  Diagnosis Date  . Psoriasis   . Reflux   . Arthritis   . Fluid retention     Past Surgical History  Procedure Laterality Date  . Appendectomy    . Abdominal hysterectomy    . Vocal cord surgery    . Shoulder surgery Right     There were no vitals filed for this visit.  Visit Diagnosis:  Left knee pain  Knee stiffness, left      Subjective Assessment - 04/14/15 1403    Subjective stiff today again today with the weather   Limitations Sitting   How long can you sit comfortably? Dining room chair- 15 min, vanity chair- 30 min    How long can you stand comfortably? 15 minutes   How long can you walk comfortably? 30 minutes   Currently in Pain? Yes   Pain Score 3    Pain Location Knee   Pain Orientation Left   Pain Descriptors / Indicators Aching   Pain Type Surgical pain   Pain Onset More than a month ago   Pain Frequency Intermittent   Aggravating Factors  swells when standing too long   Pain Relieving Factors rest, ice            [facility] PT Assessment - 04/14/15 0001    PROM   Overall PROM  Deficits   PROM Assessment Site Knee   Right/Left Knee Left   Left Knee Flexion 110                     [facility] Adult PT Treatment/Exercise - 04/14/15 0001    Knee/Hip Exercises: Aerobic   Stationary Bike x 10 min   Elliptical NuStep x10 minutes, seat advanced periodically to increase L knee flexion   Manual  Therapy   Manual Therapy Passive ROM   Massage scar mobs IASTM around incision and knee joint   Passive ROM low load holds for flexion                     PT Long Term Goals - 03/29/15 1400    PT LONG TERM GOAL #1   Title Ind with advanced HEP.   Time 6   Period Weeks   Status Achieved   PT LONG TERM GOAL #2   Title Full left knee extension.   Time 6   Period Weeks   Status Achieved   PT LONG TERM GOAL #3   Title Active left knee flexion= 115 degrees.   Time 6   Period Weeks   Status On-going  03/29/2015 L knee AROM flexion 108 deg   PT LONG TERM GOAL #4   Title Perform ADL's with pain not > 3/10   Time 6   Period Weeks   Status Achieved   PT LONG TERM GOAL #5   Title Patient perform a reciprocating  stair gait with one railing.   Time 6   Period Weeks   Status Achieved               Plan - 04/14/15 1402    Clinical Impression Statement Pt did good with Rx. Her ROM for flexion increased to 110 degrees today. ROM goal NM and is On-going   Rehab Potential Good   PT Frequency 3x / week   PT Duration 6 weeks   PT Treatment/Interventions Therapeutic exercise   PT Next Visit Plan N.O. for Pt to cont PT 2xwk/ 4 wks        Problem List There are no active problems to display for this patient.   RAMSEUR,CHRIS, PTA 04/14/2015, 2:41 PM  [facility] [address] [place], [place], [address] Phone: [phone]   Fax:  [phone]

## 2015-04-19 ENCOUNTER — Encounter: Payer: Self-pay | Admitting: Physical Therapy

## 2015-04-19 ENCOUNTER — Ambulatory Visit: Payer: Federal, State, Local not specified - PPO | Attending: Student | Admitting: Physical Therapy

## 2015-04-19 DIAGNOSIS — M179 Osteoarthritis of knee, unspecified: Secondary | ICD-10-CM | POA: Insufficient documentation

## 2015-04-19 DIAGNOSIS — M25662 Stiffness of left knee, not elsewhere classified: Secondary | ICD-10-CM

## 2015-04-19 DIAGNOSIS — M25562 Pain in left knee: Secondary | ICD-10-CM | POA: Insufficient documentation

## 2015-04-19 NOTE — Therapy (Signed)
[facility] [address] [place], [place], [address] Phone: [phone]   Fax:  [phone]  Physical Therapy Treatment  Patient Details  Name: Tricia Baker MRN: [id number] Date of Birth: 23-Feb-1951 Referring Provider:  Remus LofflerJones, Angel S, PA-C  Encounter Date: 04/19/2015      PT End of Session - 04/19/15 1404    Visit Number 17   Number of Visits 24   Date for PT Re-Evaluation 05/12/15   PT Start Time 1350   PT Stop Time 1445   PT Time Calculation (min) 55 min   Activity Tolerance Patient tolerated treatment well   Behavior During Therapy [facility] for tasks assessed/performed      Past Medical History  Diagnosis Date  . Psoriasis   . Reflux   . Arthritis   . Fluid retention     Past Surgical History  Procedure Laterality Date  . Appendectomy    . Abdominal hysterectomy    . Vocal cord surgery    . Shoulder surgery Right     There were no vitals filed for this visit.  Visit Diagnosis:  Knee stiffness, left  Left knee pain      Subjective Assessment - 04/19/15 1403    Subjective States that her knee feels thick and achey due to weather. Had a long weekend of driving this weekend.   Currently in Pain? Yes   Pain Score 2    Pain Location Knee   Pain Orientation Left   Pain Descriptors / Indicators Aching   Pain Type Surgical pain            [facility] PT Assessment - 04/19/15 0001    Assessment   Medical Diagnosis Left Total knee arthroplasty   Onset Date 01/26/15                     [facility] PT Treatment/Exercise - 04/19/15 0001    Knee/Hip Exercises: Aerobic   Stationary Bike x 10 min   Elliptical NuStep x10 min for ROM   Knee/Hip Exercises: Seated   Other Seated Knee Exercises L knee contract/relax x10 reps to increase L knee flexion   Modalities   Modalities Cryotherapy;Electrical Stimulation   Cryotherapy   Number Minutes Cryotherapy 15 Minutes   Cryotherapy Location Knee   Type of  Cryotherapy Other (comment)  Medium Vasopneumatic   Electrical Stimulation   Electrical Stimulation Location left knee   Electrical Stimulation Action IFC   Electrical Stimulation Parameters 1-10 Hz   Electrical Stimulation Goals Pain;Edema   Manual Therapy   Manual Therapy Myofascial release   Myofascial Release IASTW around L knee incision along with L patellar mobs x488min                     PT Long Term Goals - 03/29/15 1400    PT LONG TERM GOAL #1   Title Ind with advanced HEP.   Time 6   Period Weeks   Status Achieved   PT LONG TERM GOAL #2   Title Full left knee extension.   Time 6   Period Weeks   Status Achieved   PT LONG TERM GOAL #3   Title Active left knee flexion= 115 degrees.   Time 6   Period Weeks   Status On-going  03/29/2015 L knee AROM flexion 108 deg   PT LONG TERM GOAL #4   Title Perform ADL's with pain not > 3/10   Time 6   Period  Weeks   Status Achieved   PT LONG TERM GOAL #5   Title Patient perform a reciprocating stair gait with one railing.   Time 6   Period Weeks   Status Achieved               Plan - 04/19/15 1439    Clinical Impression Statement Patient continues to tolerate exercises well without complaint of increased pain. Remaining goals remains on-going secondary to decreased ROM. Normal modalities response noted following removal of the modalties. IASTW and patellar mobs utilized in order to decrease adhesions and protect normal patellar tracking.  Denied pain following treatment.   Rehab Potential Good   PT Frequency 3x / week   PT Duration 6 weeks   PT Treatment/Interventions Therapeutic exercise   PT Next Visit Plan Continue per PT POC    Consulted and Agree with Plan of Care Patient        Problem List There are no active problems to display for this patient.   Evelene Croon, PTA 04/19/2015, 2:48 PM  [facility] [address] [place], [place],  [address] Phone: [phone]   Fax:  [phone]

## 2015-04-21 ENCOUNTER — Encounter: Payer: Self-pay | Admitting: Physical Therapy

## 2015-04-21 ENCOUNTER — Ambulatory Visit: Payer: Federal, State, Local not specified - PPO | Admitting: Physical Therapy

## 2015-04-21 DIAGNOSIS — M25562 Pain in left knee: Secondary | ICD-10-CM

## 2015-04-21 DIAGNOSIS — M179 Osteoarthritis of knee, unspecified: Secondary | ICD-10-CM | POA: Diagnosis not present

## 2015-04-21 DIAGNOSIS — M25662 Stiffness of left knee, not elsewhere classified: Secondary | ICD-10-CM

## 2015-04-21 NOTE — Therapy (Signed)
[facility] [address] [place], [place], [address] Phone: [phone]   Fax:  [phone]  Physical Therapy Treatment  Patient Details  Name: Tricia LawmanSara S Baker MRN: [id number] Date of Birth: 1951/08/16 Referring Provider:  Remus LofflerJones, Angel S, PA-C  Encounter Date: 04/21/2015      PT End of Session - 04/21/15 1358    Visit Number 18   Number of Visits 24   Date for PT Re-Evaluation 05/12/15   PT Start Time 1345   PT Stop Time 1442   PT Time Calculation (min) 57 min   Activity Tolerance Patient tolerated treatment well   Behavior During Therapy [facility] for tasks assessed/performed      Past Medical History  Diagnosis Date  . Psoriasis   . Reflux   . Arthritis   . Fluid retention     Past Surgical History  Procedure Laterality Date  . Appendectomy    . Abdominal hysterectomy    . Vocal cord surgery    . Shoulder surgery Right     There were no vitals filed for this visit.  Visit Diagnosis:  Knee stiffness, left  Left knee pain      Subjective Assessment - 04/21/15 1347    Subjective States that her knee is achey and sore due to weather. She has been sitting a lot today too and that makes her knee is stiff and sore.   Limitations Sitting   How long can you sit comfortably? Dining room chair- 15 min, vanity chair- 30 min    How long can you stand comfortably? 15 minutes   How long can you walk comfortably? 30 minutes            [facility] - 04/21/15 0001    Assessment   Medical Diagnosis Left Total knee arthroplasty   Onset Date 01/26/15   PROM   Overall PROM  Deficits   PROM Assessment Site Knee   Right/Left Knee Left   Left Knee Extension 7   Left Knee Flexion 112                     [facility] Adult PT Treatment/Exercise - 04/21/15 0001    Knee/Hip Exercises: Aerobic   Stationary Bike x 10 min   Elliptical NuStep x8015min for ROM   Knee/Hip Exercises: Standing   Other Standing Knee  Exercises Mini squats to increase L knee flexion x20 reps   Knee/Hip Exercises: Seated   Other Seated Knee Exercises L knee contract/relax x10 reps to increase L knee flexion                     PT Long Term Goals - 04/21/15 1401    PT LONG TERM GOAL #1   Title Ind with advanced HEP.   Time 6   Period Weeks   Status Achieved   PT LONG TERM GOAL #2   Title Full left knee extension.   Time 6   Period Weeks   Status Achieved   PT LONG TERM GOAL #3   Title Active left knee flexion= 115 degrees.   Time 6   Period Weeks   Status On-going  03/29/2015 L knee AROM flexion 108 deg   PT LONG TERM GOAL #4   Title Perform ADL's with pain not > 3/10   Time 6   Period Weeks   Status Achieved   PT LONG TERM GOAL #5   Title Patient perform a reciprocating stair gait  with one railing.   Time 6   Period Weeks   Status Achieved               Plan - 04/21/15 1427    Clinical Impression Statement Patient continues to tolerate exercises and treatment well without complaint of increased pain. Normal modaltiies response noted following the removal of the modalities. Remaining goal of L knee flexion to 115 degrees has not yet been achieved due to lack of L knee flexion that may be due to weather. Denied pain following treatment.   Rehab Potential Good   PT Frequency 3x / week   PT Duration 6 weeks   PT Treatment/Interventions Therapeutic exercise   PT Next Visit Plan Continue per PT POC    Consulted and Agree with Plan of Care Patient        Problem List There are no active problems to display for this patient.   Evelene CroonKelsey M Parsons, PTA 04/21/2015, 2:46 PM  [facility] [address] [place], [place], [address] Phone: [phone]   Fax:  [phone]

## 2015-04-26 ENCOUNTER — Ambulatory Visit: Payer: Federal, State, Local not specified - PPO | Admitting: Physical Therapy

## 2015-04-26 ENCOUNTER — Encounter: Payer: Self-pay | Admitting: Physical Therapy

## 2015-04-26 DIAGNOSIS — M179 Osteoarthritis of knee, unspecified: Secondary | ICD-10-CM | POA: Diagnosis not present

## 2015-04-26 DIAGNOSIS — M25562 Pain in left knee: Secondary | ICD-10-CM

## 2015-04-26 DIAGNOSIS — M25662 Stiffness of left knee, not elsewhere classified: Secondary | ICD-10-CM

## 2015-04-26 NOTE — Therapy (Signed)
[facility] [address] [place], [place], [address] Phone: [phone]   Fax:  [phone]  Physical Therapy Treatment  Patient Details  Name: Tricia Baker MRN: [id number] Date of Birth: [birth year] Referring Provider:  Remus LofflerJones, Angel S, PA-C  Encounter Date: 04/26/2015      PT End of Session - 04/26/15 1230    Visit Number 19   Number of Visits 24   Date for PT Re-Evaluation 05/12/15   PT Start Time 1159   PT Stop Time 1233   PT Time Calculation (min) 34 min   Activity Tolerance Patient tolerated treatment well   Behavior During Therapy [facility] for tasks assessed/performed      Past Medical History  Diagnosis Date  . Psoriasis   . Reflux   . Arthritis   . Fluid retention     Past Surgical History  Procedure Laterality Date  . Appendectomy    . Abdominal hysterectomy    . Vocal cord surgery    . Shoulder surgery Right     There were no vitals filed for this visit.  Visit Diagnosis:  Knee stiffness, left  Left knee pain      Subjective Assessment - 04/26/15 1202    Subjective the weather makes knee a little stiff and sore   Limitations Sitting   How long can you sit comfortably? Dining room chair- 15 min, vanity chair- 30 min    How long can you stand comfortably? 15 minutes   How long can you walk comfortably? 30 minutes   Currently in Pain? Yes   Pain Score 2    Pain Location Knee   Pain Orientation Left   Pain Descriptors / Indicators Aching   Pain Type Surgical pain   Pain Onset More than a month ago   Pain Frequency Intermittent   Aggravating Factors  weather and standing too long   Pain Relieving Factors rest            [facility] PT Assessment - 04/26/15 0001    AROM   Overall AROM  Deficits   AROM Assessment Site Knee   Right/Left Knee Left   Left Knee Flexion 100   PROM   Overall PROM  Deficits   PROM Assessment Site Knee   Right/Left Knee Left   Left Knee Flexion 108                      [facility] Adult PT Treatment/Exercise - 04/26/15 0001    Knee/Hip Exercises: Stretches   Knee: Self-Stretch to increase Flexion 3 reps;30 seconds;Limitations   Knee/Hip Exercises: Aerobic   Stationary Bike x 10 min adjusted for ROM   Elliptical Nustep L5 x3210min    Manual Therapy   Manual Therapy Passive ROM   Passive ROM low load holds for flexion                     PT Long Term Goals - 04/21/15 1401    PT LONG TERM GOAL #1   Title Ind with advanced HEP.   Time 6   Period Weeks   Status Achieved   PT LONG TERM GOAL #2   Title Full left knee extension.   Time 6   Period Weeks   Status Achieved   PT LONG TERM GOAL #3   Title Active left knee flexion= 115 degrees.   Time 6   Period Weeks   Status On-going  03/29/2015 L knee AROM flexion 108  deg   PT LONG TERM GOAL #4   Title Perform ADL's with pain not > 3/10   Time 6   Period Weeks   Status Achieved   PT LONG TERM GOAL #5   Title Patient perform a reciprocating stair gait with one railing.   Time 6   Period Weeks   Status Achieved               Plan - 04/26/15 1233    Clinical Impression Statement patient progressing slowly with ROM due to stiffness today per patient. educated patient on self gentle flexion ROM 3-4 times a day to increase flexability. ROM goal ongoing due to flexion restrictions    Rehab Potential Good   PT Frequency 3x / week   PT Duration 6 weeks   PT Treatment/Interventions Therapeutic exercise   PT Next Visit Plan Continue per PT POC    Consulted and Agree with Plan of Care Patient        Problem List There are no active problems to display for this patient.   Hermelinda DellenDUNFORD, Elias Bordner P, PTA 04/26/2015, 12:36 PM  [facility] [address] [place], [place], [address] Phone: [phone]   Fax:  [phone]

## 2015-04-28 ENCOUNTER — Encounter: Payer: Self-pay | Admitting: Physical Therapy

## 2015-04-28 ENCOUNTER — Ambulatory Visit: Payer: Federal, State, Local not specified - PPO | Admitting: Physical Therapy

## 2015-04-28 DIAGNOSIS — M25562 Pain in left knee: Secondary | ICD-10-CM

## 2015-04-28 DIAGNOSIS — M179 Osteoarthritis of knee, unspecified: Secondary | ICD-10-CM | POA: Diagnosis not present

## 2015-04-28 DIAGNOSIS — M25662 Stiffness of left knee, not elsewhere classified: Secondary | ICD-10-CM

## 2015-04-28 NOTE — Therapy (Signed)
[facility] [address] [place], [place], [address] Phone: [phone]   Fax:  [phone]  Physical Therapy Treatment  Patient Details  Name: Tricia Baker MRN: [id number] Date of Birth: 19-Oct-1951 Referring Provider:  Remus LofflerJones, Angel S, PA-C  Encounter Date: 04/28/2015      PT End of Session - 04/28/15 1345    Visit Number 20   Number of Visits 24   Date for PT Re-Evaluation 05/12/15   PT Start Time 1314   PT Stop Time 1345   PT Time Calculation (min) 31 min   Activity Tolerance Patient tolerated treatment well   Behavior During Therapy [facility] for tasks assessed/performed      Past Medical History  Diagnosis Date  . Psoriasis   . Reflux   . Arthritis   . Fluid retention     Past Surgical History  Procedure Laterality Date  . Appendectomy    . Abdominal hysterectomy    . Vocal cord surgery    . Shoulder surgery Right     There were no vitals filed for this visit.  Visit Diagnosis:  Knee stiffness, left  Left knee pain      Subjective Assessment - 04/28/15 1326    Subjective not feeling well today, knee is less sore today   Limitations Sitting   How long can you sit comfortably? Dining room chair- 15 min, vanity chair- 30 min    How long can you stand comfortably? 15 minutes   How long can you walk comfortably? 30 minutes   Currently in Pain? Yes   Pain Score 2    Pain Location Knee   Pain Orientation Left   Pain Descriptors / Indicators Aching   Pain Type Surgical pain   Pain Onset More than a month ago   Pain Frequency Intermittent   Aggravating Factors  increased   Pain Relieving Factors rest            [facility] PT Assessment - 04/28/15 0001    AROM   Overall AROM  Deficits   AROM Assessment Site Knee   Right/Left Knee Left   Left Knee Flexion 100   PROM   Overall PROM  Deficits   PROM Assessment Site Knee   Right/Left Knee Left   Left Knee Flexion 110                      [facility] Adult PT Treatment/Exercise - 04/28/15 0001    Knee/Hip Exercises: Aerobic   Stationary Bike x948min   Elliptical Nustep L5 x4110min    Manual Therapy   Manual Therapy Passive ROM   Passive ROM low load holds for flexion                     PT Long Term Goals - 04/21/15 1401    PT LONG TERM GOAL #1   Title Ind with advanced HEP.   Time 6   Period Weeks   Status Achieved   PT LONG TERM GOAL #2   Title Full left knee extension.   Time 6   Period Weeks   Status Achieved   PT LONG TERM GOAL #3   Title Active left knee flexion= 115 degrees.   Time 6   Period Weeks   Status On-going  03/29/2015 L knee AROM flexion 108 deg   PT LONG TERM GOAL #4   Title Perform ADL's with pain not > 3/10   Time 6  Period Weeks   Status Achieved   PT LONG TERM GOAL #5   Title Patient perform a reciprocating stair gait with one railing.   Time 6   Period Weeks   Status Achieved               Plan - 04/28/15 1346    Clinical Impression Statement patient tolerated treatment well, not feeling good today and sick all day yesterday. had improvement with PROM for left knee flexion. ROM goal ongoing due to restrictions.   Rehab Potential Good   PT Frequency 3x / week   PT Duration 6 weeks   PT Treatment/Interventions Therapeutic exercise   PT Next Visit Plan Continue per PT POC    Consulted and Agree with Plan of Care Patient        Problem List There are no active problems to display for this patient.   Hermelinda DellenDUNFORD, CHRISTINA P, PTA 04/28/2015, 1:49 PM  [facility] [address] [place], [place], [address] Phone: [phone]   Fax:  [phone]

## 2015-05-03 ENCOUNTER — Ambulatory Visit: Payer: Federal, State, Local not specified - PPO | Admitting: Physical Therapy

## 2015-05-05 ENCOUNTER — Encounter: Payer: Self-pay | Admitting: Physical Therapy

## 2015-05-05 ENCOUNTER — Ambulatory Visit: Payer: Federal, State, Local not specified - PPO | Admitting: Physical Therapy

## 2015-05-05 DIAGNOSIS — M25662 Stiffness of left knee, not elsewhere classified: Secondary | ICD-10-CM

## 2015-05-05 DIAGNOSIS — M179 Osteoarthritis of knee, unspecified: Secondary | ICD-10-CM | POA: Diagnosis not present

## 2015-05-05 DIAGNOSIS — M25562 Pain in left knee: Secondary | ICD-10-CM

## 2015-05-05 NOTE — Therapy (Signed)
[facility] [place], [place], [address] Phone: [phone]   Fax:  [phone]  Physical Therapy Treatment  Patient Details  Name: Tricia Baker MRN: [id number] Date of Birth: 28-Dec-1950 Referring Provider:  Terald Sleeper, PA-C  Encounter Date: 05/05/2015      PT End of Session - 05/05/15 1327    Visit Number 21   Number of Visits 24   Date for PT Re-Evaluation 05/12/15   PT Start Time 1314   PT Stop Time 1354   PT Time Calculation (min) 40 min   Activity Tolerance Patient tolerated treatment well   Behavior During Therapy [facility] for tasks assessed/performed      Past Medical History  Diagnosis Date  . Psoriasis   . Reflux   . Arthritis   . Fluid retention     Past Surgical History  Procedure Laterality Date  . Appendectomy    . Abdominal hysterectomy    . Vocal cord surgery    . Shoulder surgery Right     There were no vitals filed for this visit.  Visit Diagnosis:  Knee stiffness, left  Left knee pain      Subjective Assessment - 05/05/15 1318    Subjective feeling sluggish today   Limitations Sitting   How long can you sit comfortably? Dining room chair- 15 min, vanity chair- 30 min    How long can you stand comfortably? 15 minutes   How long can you walk comfortably? 30 minutes   Currently in Pain? Yes   Pain Score 2    Pain Location Knee   Pain Orientation Left   Pain Descriptors / Indicators Sore   Pain Type Surgical pain   Pain Onset More than a month ago   Pain Frequency Intermittent   Aggravating Factors  weather and increased activity   Pain Relieving Factors rest            [facility] PT Assessment - 05/05/15 0001    AROM   Overall AROM  Deficits   AROM Assessment Site Knee   Right/Left Knee Left   Left Knee Flexion 100   PROM   Overall PROM  Deficits   PROM Assessment Site Knee   Right/Left Knee Left   Left Knee Flexion 110                     [facility]  Adult PT Treatment/Exercise - 05/05/15 0001    Knee/Hip Exercises: Stretches   Knee: Self-Stretch to increase Flexion 3 reps;30 seconds;Limitations   Knee/Hip Exercises: Aerobic   Stationary Bike x68mn   Elliptical Nustep L4 x174m    Manual Therapy   Manual Therapy Passive ROM   Passive ROM low load holds for flexion                     PT Long Term Goals - 05/05/15 1356    PT LONG TERM GOAL #1   Title Ind with advanced HEP.   Time 6   Period Weeks   Status Achieved   PT LONG TERM GOAL #2   Title Full left knee extension.   Time 6   Period Weeks   Status Achieved   PT LONG TERM GOAL #3   Title Active left knee flexion= 115 degrees.   Time 6   Period Weeks   Status On-going  PROM 110 / AROM 100   PT LONG TERM GOAL #4   Title Perform ADL's with  pain not > 3/10   Time 6   Status Achieved   PT LONG TERM GOAL #5   Title Patient perform a reciprocating stair gait with one railing.   Time 6   Period Weeks   Status Achieved               Plan - 05/05/15 1329    Clinical Impression Statement patient continues to progress with all activities. Has little soreness overall and has met all current goals except left knee flexion due to edema/restrictions   Rehab Potential Good   PT Frequency 3x / week   PT Duration 6 weeks   PT Treatment/Interventions Therapeutic exercise   PT Next Visit Plan Continue per PT POC per MD [facility]   Consulted and Agree with Plan of Care Patient        Problem List There are no active problems to display for this patient.  Ladean Raya, PTA 05/05/2015 1:58 PM DUNFORD, CHRISTINA P, PTA 05/05/2015, 1:57 PM  [facility] [address] [place], [place], [address] Phone: [phone]   Fax:  [phone]

## 2015-05-09 NOTE — Therapy (Signed)
[facility] [place], [place], [address] Phone: [phone]   Fax:  [phone]  Physical Therapy Treatment  Patient Details  Name: Tricia Baker MRN: [id number] Date of Birth: 1951/09/06 Referring Provider:  Terald Sleeper, PA-C  Encounter Date: 05/05/2015    Past Medical History  Diagnosis Date  . Psoriasis   . Reflux   . Arthritis   . Fluid retention     Past Surgical History  Procedure Laterality Date  . Appendectomy    . Abdominal hysterectomy    . Vocal cord surgery    . Shoulder surgery Right     There were no vitals filed for this visit.  Visit Diagnosis:  Knee stiffness, left  Left knee pain                                    PT Long Term Goals - 05/05/15 1356    PT LONG TERM GOAL #1   Title Ind with advanced HEP.   Time 6   Period Weeks   Status Achieved   PT LONG TERM GOAL #2   Title Full left knee extension.   Time 6   Period Weeks   Status Achieved   PT LONG TERM GOAL #3   Title Active left knee flexion= 115 degrees.   Time 6   Period Weeks   Status On-going  PROM 110 / AROM 100   PT LONG TERM GOAL #4   Title Perform ADL's with pain not > 3/10   Time 6   Status Achieved   PT LONG TERM GOAL #5   Title Patient perform a reciprocating stair gait with one railing.   Time 6   Period Weeks   Status Achieved       PHYSICAL THERAPY DISCHARGE SUMMARY  Visits from Start of Care:   Current functional level related to goals / functional outcomes: Al goals met.   Remaining deficits: None reported.   Education / Equipment: HEP. Plan: Patient agrees to discharge.  Patient goals were met. Patient is being discharged due to meeting the stated rehab goals.  ?????             Problem List There are no active problems to display for this patient.   Akeila Lana, [place] MPT 05/09/2015, 1:27 PM  [facility] [address] [place], [place], [address] Phone: [phone]   Fax:  [phone]

## 2015-05-10 ENCOUNTER — Encounter: Payer: Federal, State, Local not specified - PPO | Admitting: Physical Therapy

## 2015-05-12 ENCOUNTER — Encounter: Payer: Federal, State, Local not specified - PPO | Admitting: Physical Therapy

## 2016-01-26 NOTE — Therapy (Signed)
[facility] [place], [place], [address] Phone: [phone]   Fax:  [phone]  Physical Therapy Treatment  Patient Details  Name: Tricia Baker MRN: [id number] Date of Birth: 02-28-1951 No Data Recorded  Encounter Date: 05/05/2015    Past Medical History  Diagnosis Date  . Psoriasis   . Reflux   . Arthritis   . Fluid retention     Past Surgical History  Procedure Laterality Date  . Appendectomy    . Abdominal hysterectomy    . Vocal cord surgery    . Shoulder surgery Right     There were no vitals filed for this visit.  Visit Diagnosis:  Knee stiffness, left  Left knee pain                                    PT Long Term Goals - 05/05/15 1356    PT LONG TERM GOAL #1   Title Ind with advanced HEP.   Time 6   Period Weeks   Status Achieved   PT LONG TERM GOAL #2   Title Full left knee extension.   Time 6   Period Weeks   Status Achieved   PT LONG TERM GOAL #3   Title Active left knee flexion= 115 degrees.   Time 6   Period Weeks   Status On-going  PROM 110 / AROM 100   PT LONG TERM GOAL #4   Title Perform ADL's with pain not > 3/10   Time 6   Status Achieved   PT LONG TERM GOAL #5   Title Patient perform a reciprocating stair gait with one railing.   Time 6   Period Weeks   Status Achieved               Problem List There are no active problems to display for this patient. PHYSICAL THERAPY DISCHARGE SUMMARY  Visits from Start of Care:   Current functional level related to goals / functional outcomes: Please see above.   Remaining deficits: Goal #3 unmet.   Education / Equipment: HEP.  Plan: Patient agrees to discharge.  Patient goals were partially met. Patient is being discharged due to the physician's request.  ?????       APPLEGATE, [place] MPT 01/26/2016, 2:18 PM  [facility] [place], [place], [address] Phone: [phone]   Fax:  [phone]  Name: Tricia Baker MRN: [id number] Date of Birth: 1951/03/21

## 2016-06-23 ENCOUNTER — Encounter (HOSPITAL_COMMUNITY): Payer: Self-pay | Admitting: Emergency Medicine

## 2016-06-23 ENCOUNTER — Emergency Department (HOSPITAL_COMMUNITY)
Admission: EM | Admit: 2016-06-23 | Discharge: 2016-06-23 | Disposition: A | Discharge status: Home / Self Care | Payer: Federal, State, Local not specified - PPO | Attending: Emergency Medicine | Admitting: Emergency Medicine

## 2016-06-23 DIAGNOSIS — F17210 Nicotine dependence, cigarettes, uncomplicated: Secondary | ICD-10-CM | POA: Insufficient documentation

## 2016-06-23 DIAGNOSIS — M1990 Unspecified osteoarthritis, unspecified site: Secondary | ICD-10-CM | POA: Insufficient documentation

## 2016-06-23 DIAGNOSIS — Z79899 Other long term (current) drug therapy: Secondary | ICD-10-CM | POA: Insufficient documentation

## 2016-06-23 DIAGNOSIS — T7840XA Allergy, unspecified, initial encounter: Secondary | ICD-10-CM | POA: Insufficient documentation

## 2016-06-23 MED ORDER — EPINEPHRINE 0.3 MG/0.3ML IJ DEVI: 0.3000 mg | Freq: Once | INTRAMUSCULAR | Status: DC

## 2016-06-23 NOTE — ED Notes (Signed)
Pt states she is feeling better.  No distress. Lungs clear.

## 2016-06-23 NOTE — ED Provider Notes (Signed)
CSN: [id number]     Arrival date & time 06/23/16  1713 History   First MD Initiated Contact with Patient 06/23/16 1727     Chief Complaint  Patient presents with  . Allergic Reaction     (Consider location/radiation/quality/duration/timing/severity/associated sxs/prior Treatment) Patient is a 65 y.o. female presenting with allergic reaction.  Allergic Reaction Presenting symptoms: difficulty breathing, difficulty swallowing, itching and rash   Difficulty breathing:    Onset quality:  Sudden Severity:  Moderate Prior allergic episodes:  Insect allergies Context: insect bite/sting   Context: no food allergies   Relieved by:  Epinephrine Worsened by:  Nothing tried Ineffective treatments:  None tried   Past Medical History  Diagnosis Date  . Psoriasis   . Reflux   . Arthritis   . Fluid retention    Past Surgical History  Procedure Laterality Date  . Appendectomy    . Abdominal hysterectomy    . Vocal cord surgery    . Shoulder surgery Right   . Knee surgery     Family History  Problem Relation Age of Onset  . Stroke Mother   . Diabetes Father    Social History  Substance Use Topics  . Smoking status: Current Every Day Smoker -- 0.05 packs/day for 45 years    Types: Cigarettes  . Smokeless tobacco: Never Used  . Alcohol Use: Yes     Comment: occasional   OB History    Gravida Para Term Preterm AB TAB SAB Ectopic Multiple Living   2 1 1  1  1   1      Review of Systems  HENT: Positive for trouble swallowing. Negative for congestion and drooling.        'throat closing'  Gastrointestinal: Negative for nausea and vomiting.  Skin: Positive for itching and rash.  All other systems reviewed and are negative.     Allergies  Bee venom; Penicillins; Sulfa antibiotics; and Nickel  Home Medications   Prior to Admission medications   Medication Sig Start Date End Date Taking? Authorizing Provider  albuterol (PROVENTIL HFA;VENTOLIN HFA) 108 (90 BASE) MCG/ACT  inhaler Inhale 2 puffs into the lungs every 6 (six) hours as needed. For shortness of breath due to allergic reactions   Yes Historical Provider, MD  Desoximetasone 0.25 % LIQD Apply 1 application topically 2 (two) times daily. To feet 04/26/16  Yes Historical Provider, MD  esomeprazole (NEXIUM) 40 MG capsule Take 40 mg by mouth daily before breakfast.   Yes Historical Provider, MD  famotidine (PEPCID) 40 MG tablet Take 40 mg by mouth daily.   Yes Historical Provider, MD  hydrochlorothiazide (HYDRODIURIL) 25 MG tablet Take 25 mg by mouth every morning.   Yes Historical Provider, MD  meloxicam (MOBIC) 15 MG tablet Take 15 mg by mouth daily.   Yes Historical Provider, MD  Ustekinumab (STELARA) 90 MG/ML SOLN Inject 90 mg into the skin every 3 (three) months.   Yes Historical Provider, MD  valACYclovir (VALTREX) 500 MG tablet Take 500 mg by mouth daily.   Yes Historical Provider, MD  Vitamin D, Ergocalciferol, (DRISDOL) 50000 units CAPS capsule Take 50,000 Units by mouth 2 (two) times a week.   Yes Historical Provider, MD  EPINEPHrine (EPI-PEN) 0.3 mg/0.3 mL DEVI Inject 0.3 mLs (0.3 mg total) into the muscle once. 06/23/16   Marily MemosJason Baleria Wyman, MD   BP 123/57 mmHg  Pulse 71  Temp(Src) 98 F (36.7 C) (Oral)  Resp 18  Wt 197 lb (89.359 kg)  SpO2 96%  Physical Exam  Constitutional: She is oriented to person, place, and time. She appears well-developed and well-nourished.  HENT:  Head: Normocephalic and atraumatic.  No stridor or angioedema  Neck: Normal range of motion.  Cardiovascular: Normal rate and regular rhythm.   Pulmonary/Chest: No stridor. No respiratory distress. She has no wheezes. She has no rales.  Abdominal: Soft. She exhibits no distension. There is no tenderness. There is no rebound.  Neurological: She is alert and oriented to person, place, and time.  Skin: Skin is warm and dry.  Nursing note and vitals reviewed.   ED Course  Procedures (including critical care time) Labs  Review Labs Reviewed - No data to display  Imaging Review No results found. I have personally reviewed and evaluated these images and lab results as part of my medical decision-making.   EKG Interpretation None      MDM   Final diagnoses:  Allergic reaction, initial encounter    Possible anaphylactic reaction to insect sting. Subjective feeling of throat closing so gave himself epinephrine. On my evaluation patient no stridor, respiratory distress, hypoxia, tachycardia wheezing, nausea, vomiting or other signs of anaphylaxis however observed for 4 hours to ensure she did not develop any rebound reaction. Refill provided for her EpiPen.  New Prescriptions: Discharge Medication List as of 06/23/2016  9:15 PM      I have personally and contemperaneously reviewed labs and imaging and used in my decision making as above.   A medical screening exam was performed and I feel the patient has had an appropriate workup for their chief complaint at this time and likelihood of emergent condition existing is low and thus workup can continue on an outpatient basis.. Their vital signs are stable. They have been counseled on decision, discharge, follow up and which symptoms necessitate immediate return to the emergency department.  They verbally stated understanding and agreement with plan and discharged in stable condition.   Marily Memos, MD 06/24/16 [phone]

## 2016-06-23 NOTE — ED Notes (Signed)
No distress.

## 2016-06-23 NOTE — ED Notes (Signed)
Patient states some kind of sting around 2 pm, when she noticed burning on chest. Patient states around 1700, patient states throat tightness and shortness of breath. At that time she took epi-pen on trip to ED. Patient is alert, oriented. Husband states voice has changed, deeper and raspy. Shaking.

## 2016-06-23 NOTE — ED Notes (Signed)
Patient took 40 mg Pepcid, 50 mg benadryl, and epipen PTA. Patient takes Pepcid 40 mg daily, so has had a total dose of 80 mg today.

## 2016-11-27 ENCOUNTER — Ambulatory Visit (INDEPENDENT_AMBULATORY_CARE_PROVIDER_SITE_OTHER): Payer: Federal, State, Local not specified - PPO | Admitting: Physician Assistant

## 2016-11-27 ENCOUNTER — Encounter: Payer: Self-pay | Admitting: Physician Assistant

## 2016-11-27 VITALS — BP 108/75 | HR 68 | Temp 98.2°F | Ht 66.0 in | Wt 208.0 lb

## 2016-11-27 DIAGNOSIS — T7840XA Allergy, unspecified, initial encounter: Secondary | ICD-10-CM | POA: Insufficient documentation

## 2016-11-27 DIAGNOSIS — Z227 Latent tuberculosis: Secondary | ICD-10-CM

## 2016-11-27 DIAGNOSIS — Z79899 Other long term (current) drug therapy: Secondary | ICD-10-CM

## 2016-11-27 DIAGNOSIS — M159 Polyosteoarthritis, unspecified: Secondary | ICD-10-CM

## 2016-11-27 DIAGNOSIS — R7611 Nonspecific reaction to tuberculin skin test without active tuberculosis: Secondary | ICD-10-CM

## 2016-11-27 DIAGNOSIS — T7840XS Allergy, unspecified, sequela: Secondary | ICD-10-CM

## 2016-11-27 DIAGNOSIS — Z0000 Encounter for general adult medical examination without abnormal findings: Secondary | ICD-10-CM | POA: Diagnosis not present

## 2016-11-27 DIAGNOSIS — M150 Primary generalized (osteo)arthritis: Secondary | ICD-10-CM

## 2016-11-27 DIAGNOSIS — E782 Mixed hyperlipidemia: Secondary | ICD-10-CM | POA: Insufficient documentation

## 2016-11-27 DIAGNOSIS — L400 Psoriasis vulgaris: Secondary | ICD-10-CM | POA: Insufficient documentation

## 2016-11-27 MED ORDER — VITAMIN D (ERGOCALCIFEROL) 1.25 MG (50000 UNIT) PO CAPS: 50000.0000 [IU] | ORAL_CAPSULE | ORAL | 11 refills | Status: DC

## 2016-11-27 MED ORDER — EPINEPHRINE 0.3 MG/0.3ML IJ SOAJ: 0.3000 mg | Freq: Once | INTRAMUSCULAR | 12 refills | Status: AC

## 2016-11-27 MED ORDER — CELECOXIB 200 MG PO CAPS: 200.0000 mg | ORAL_CAPSULE | Freq: Two times a day (BID) | ORAL | 6 refills | Status: DC

## 2016-11-27 MED ORDER — VALACYCLOVIR HCL 500 MG PO TABS: 500.0000 mg | ORAL_TABLET | Freq: Every day | ORAL | 11 refills | Status: DC

## 2016-11-27 MED ORDER — TOPIRAMATE 50 MG PO TABS: 100.0000 mg | ORAL_TABLET | Freq: Two times a day (BID) | ORAL | 6 refills | Status: DC

## 2016-11-27 NOTE — Patient Instructions (Signed)
Vitamin D Deficiency Vitamin D deficiency is when your body does not have enough vitamin D. Vitamin D is important to your body for many reasons:  It helps the body to absorb two important minerals, called calcium and phosphorus.  It plays a role in bone health.  It may help to prevent some diseases, such as diabetes and multiple sclerosis.  It plays a role in muscle function, including heart function. You can get vitamin D by:  Eating foods that naturally contain vitamin D.  Eating or drinking milk or other dairy products that have vitamin D added to them.  Taking a vitamin D supplement or a multivitamin supplement that contains vitamin D.  Being in the sun. Your body naturally makes vitamin D when your skin is exposed to sunlight. Your body changes the sunlight into a form of the vitamin that the body can use. If vitamin D deficiency is severe, it can cause a condition in which your bones become soft. In adults, this condition is called osteomalacia. In children, this condition is called rickets. What are the causes? Vitamin D deficiency may be caused by:  Not eating enough foods that contain vitamin D.  Not getting enough sun exposure.  Having certain digestive system diseases that make it difficult for your body to absorb vitamin D. These diseases include Crohn disease, chronic pancreatitis, and cystic fibrosis.  Having a surgery in which a part of the stomach or a part of the small intestine is removed.  Being obese.  Having chronic kidney disease or liver disease. What increases the risk? This condition is more likely to develop in:  Older people.  People who do not spend much time outdoors.  People who live in a long-term care facility.  People who have had broken bones.  People with weak or thin bones (osteoporosis).  People who have a disease or condition that changes how the body absorbs vitamin D.  People who have dark skin.  People who take certain  medicines, such as steroid medicines or certain seizure medicines.  People who are overweight or obese. What are the signs or symptoms? In mild cases of vitamin D deficiency, there may not be any symptoms. If the condition is severe, symptoms may include:  Bone pain.  Muscle pain.  Falling often.  Broken bones caused by a minor injury. How is this diagnosed? This condition is usually diagnosed with a blood test. How is this treated? Treatment for this condition may depend on what caused the condition. Treatment options include:  Taking vitamin D supplements.  Taking a calcium supplement. Your health care provider will suggest what dose is best for you. Follow these instructions at home:  Take medicines and supplements only as told by your health care provider.  Eat foods that contain vitamin D. Choices include:  Fortified dairy products, cereals, or juices. Fortified means that vitamin D has been added to the food. Check the label on the package to be sure.  Fatty fish, such as salmon or trout.  Eggs.  Oysters.  Do not use a tanning bed.  Maintain a healthy weight. Lose weight, if needed.  Keep all follow-up visits as told by your health care provider. This is important. Contact a health care provider if:  Your symptoms do not go away.  You feel like throwing up (nausea) or you throw up (vomit).  You have fewer bowel movements than usual or it is difficult for you to have a bowel movement (constipation). This information is   not intended to replace advice given to you by your health care provider. Make sure you discuss any questions you have with your health care provider. Document Released: 02/25/2012 Document Revised: 05/16/2016 Document Reviewed: 04/20/2015 Elsevier Interactive Patient Education  2017 Elsevier Inc.  

## 2016-11-27 NOTE — Progress Notes (Signed)
BP 108/75   Pulse 68   Temp 98.2 F (36.8 C) (Oral)   Ht '5\' 6"'$  (1.676 m)   Wt 208 lb (94.3 kg)   BMI 33.57 kg/m    Subjective:    Patient ID: Tricia Baker, female    DOB: 1951/03/23, 65 y.o.   MRN: [id number]  Tricia Baker is a 65 y.o. female presenting on 11/27/2016 for Annual Exam  HPI Patient here to be established as new patient at [facility].  This patient is known to me from [facility]. This patient comes in for annual exam and medication renewal. All medications are reviewed today. There are no reports of any problems with the medications. All of the medical conditions are reviewed and updated.  Lab work is reviewed and will be ordered as medically necessary.  The patient has lost 35 pounds this fall by going on a dairy healthy diet. She has eliminated sugar and malaise. From her diet. She is also limited soda. She is eating very well balanced and high fiber with protein. She states she feels very good. She also has had a slight fall last week where she twisted her left knee which has been replacement and she has some pain in the right hip that caused the weight. She denies any dysfunction in those joints at this time.  Past Medical History:  Diagnosis Date  . Arthritis   . Fluid retention   . Psoriasis   . Reflux    Relevant past medical, surgical, family and social history reviewed and updated as indicated. Interim medical history since our last visit reviewed. Allergies and medications reviewed and updated.   Data reviewed from any sources in EPIC.  Review of Systems  Constitutional: Negative.  Negative for activity change, fatigue and fever.  HENT: Negative.   Eyes: Negative.   Respiratory: Negative.  Negative for cough.   Cardiovascular: Negative.  Negative for chest pain.  Gastrointestinal: Negative.  Negative for abdominal pain.  Endocrine: Negative.   Genitourinary: Negative.  Negative for dysuria.    Musculoskeletal: Positive for arthralgias and gait problem. Negative for joint swelling.  Skin: Negative.      Social History   Social History  . Marital status: Married    Spouse name: N/A  . Number of children: N/A  . Years of education: N/A   Occupational History  . Not on file.   Social History Main Topics  . Smoking status: Current Every Day Smoker    Packs/day: 0.05    Years: 45.00    Types: Cigarettes  . Smokeless tobacco: Never Used  . Alcohol use Yes     Comment: occasional  . Drug use: No  . Sexual activity: Not on file   Other Topics Concern  . Not on file   Social History Narrative  . No narrative on file    Past Surgical History:  Procedure Laterality Date  . ABDOMINAL HYSTERECTOMY    . APPENDECTOMY    . KNEE SURGERY    . SHOULDER SURGERY Right   . vocal cord surgery      Family History  Problem Relation Age of Onset  . Stroke Mother   . Diabetes Father       Medication List       Accurate as of 11/27/16 11:15 AM. Always use your most recent med list.          albuterol 108 (90 Base) MCG/ACT inhaler Commonly known as:  PROVENTIL  HFA;VENTOLIN HFA Inhale 2 puffs into the lungs every 6 (six) hours as needed. For shortness of breath due to allergic reactions   celecoxib 200 MG capsule Commonly known as:  CELEBREX Take 1 capsule (200 mg total) by mouth 2 (two) times daily.   clobetasol ointment 0.05 % Commonly known as:  TEMOVATE APPLY TO AFFECTED AREAS ON FEET/PALMS TWICE A DAY, NOT TO FACE   Desoximetasone 0.25 % Liqd Apply 1 application topically 2 (two) times daily. To feet   EPINEPHrine 0.3 mg/0.3 mL Soaj injection Commonly known as:  EPI-PEN Inject 0.3 mLs (0.3 mg total) into the muscle once.   esomeprazole 40 MG capsule Commonly known as:  NEXIUM Take 40 mg by mouth daily before breakfast.   famotidine 40 MG tablet Commonly known as:  PEPCID Take 40 mg by mouth daily.   hydrochlorothiazide 25 MG tablet Commonly  known as:  HYDRODIURIL Take 25 mg by mouth every morning.   STELARA 90 MG/ML Soln Generic drug:  Ustekinumab Inject 90 mg into the skin every 3 (three) months.   topiramate 50 MG tablet Commonly known as:  TOPAMAX Take 2 tablets (100 mg total) by mouth 2 (two) times daily.   valACYclovir 500 MG tablet Commonly known as:  VALTREX Take 1 tablet (500 mg total) by mouth daily.   Vitamin D (Ergocalciferol) 50000 units Caps capsule Commonly known as:  DRISDOL Take 1 capsule (50,000 Units total) by mouth 2 (two) times a week. Start taking on:  11/29/2016          Objective:    BP 108/75   Pulse 68   Temp 98.2 F (36.8 C) (Oral)   Ht '5\' 6"'$  (1.676 m)   Wt 208 lb (94.3 kg)   BMI 33.57 kg/m   Allergies  Allergen Reactions  . 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  Marland Kitchen Penicillins Anaphylaxis  . Sulfa Antibiotics Anaphylaxis  . Chromium Rash  . Nickel Rash   Wt Readings from Last 3 Encounters:  11/27/16 208 lb (94.3 kg)  06/23/16 197 lb (89.4 kg)  02/02/14 197 lb (89.4 kg)    Physical Exam  Constitutional: She is oriented to person, place, and time. She appears well-developed and well-nourished.  HENT:  Head: Normocephalic and atraumatic.  Right Ear: Tympanic membrane, external ear and ear canal normal.  Left Ear: Tympanic membrane, external ear and ear canal normal.  Nose: Nose normal. No rhinorrhea.  Mouth/Throat: Oropharynx is clear and moist and mucous membranes are normal. No oropharyngeal exudate or posterior oropharyngeal erythema.  Eyes: Conjunctivae and EOM are normal. Pupils are equal, round, and reactive to light.  Neck: Normal range of motion. Neck supple.  Cardiovascular: Normal rate, regular rhythm, normal heart sounds and intact distal pulses.   Pulmonary/Chest: Effort normal and breath sounds normal.  Abdominal: Soft. Bowel sounds are normal.  Neurological: She is alert and  oriented to person, place, and time. She has normal reflexes.  Skin: Skin is warm and dry. No rash noted.  Psychiatric: She has a normal mood and affect. Her behavior is normal. Judgment and thought content normal.        Assessment & Plan:   1. 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 Psoriasis vulgaris - CBC with Differential/Platelet - CMP14+EGFR - Lipid panel - TSH  4. Mixed hyperlipidemia  5. High risk medication use - TSH  6. Allergic reaction, sequela   Continue all other maintenance medications as listed above. Educational handout given for vitamin D deficiency  Follow up plan: Return in about 6 months (around 05/28/2017) for recheck.  Terald Sleeper PA-C [facility] [address]  [place], [place] [address] [phone]   11/27/2016, 11:15 AM

## 2016-11-28 LAB — TSH: TSH: 3.57 u[IU]/mL (ref 0.450–4.500)

## 2016-11-28 LAB — CMP14+EGFR
ALBUMIN: 4.6 g/dL (ref 3.6–4.8)
ALT: 23 IU/L (ref 0–32)
AST: 22 IU/L (ref 0–40)
Albumin/Globulin Ratio: 1.7 (ref 1.2–2.2)
Alkaline Phosphatase: 88 IU/L (ref 39–117)
BILIRUBIN TOTAL: 0.5 mg/dL (ref 0.0–1.2)
BUN/Creatinine Ratio: 24 (ref 12–28)
BUN: 18 mg/dL (ref 8–27)
CHLORIDE: 96 mmol/L (ref 96–106)
CO2: 26 mmol/L (ref 18–29)
Calcium: 10 mg/dL (ref 8.7–10.3)
Creatinine, Ser: 0.74 mg/dL (ref 0.57–1.00)
GFR calc non Af Amer: 86 mL/min/{1.73_m2} (ref >59)
GFR, EST AFRICAN AMERICAN: 99 mL/min/{1.73_m2} (ref >59)
GLOBULIN, TOTAL: 2.7 g/dL (ref 1.5–4.5)
Glucose: 103 mg/dL — ABNORMAL HIGH (ref 65–99)
Potassium: 3.7 mmol/L (ref 3.5–5.2)
SODIUM: 140 mmol/L (ref 134–144)
TOTAL PROTEIN: 7.3 g/dL (ref 6.0–8.5)

## 2016-11-28 LAB — CBC WITH DIFFERENTIAL/PLATELET
BASOS: 0 %
Basophils Absolute: 0 10*3/uL (ref 0.0–0.2)
EOS (ABSOLUTE): 0.2 10*3/uL (ref 0.0–0.4)
Eos: 2 %
HEMATOCRIT: 44.4 % (ref 34.0–46.6)
HEMOGLOBIN: 14.9 g/dL (ref 11.1–15.9)
IMMATURE GRANS (ABS): 0 10*3/uL (ref 0.0–0.1)
Immature Granulocytes: 0 %
LYMPHS: 17 %
Lymphocytes Absolute: 1.5 10*3/uL (ref 0.7–3.1)
MCH: 31.1 pg (ref 26.6–33.0)
MCHC: 33.6 g/dL (ref 31.5–35.7)
MCV: 93 fL (ref 79–97)
MONOCYTES: 6 %
Monocytes Absolute: 0.5 10*3/uL (ref 0.1–0.9)
NEUTROS ABS: 6.9 10*3/uL (ref 1.4–7.0)
Neutrophils: 75 %
Platelets: 293 10*3/uL (ref 150–379)
RBC: 4.79 x10E6/uL (ref 3.77–5.28)
RDW: 14.2 % (ref 12.3–15.4)
WBC: 9.1 10*3/uL (ref 3.4–10.8)

## 2016-11-28 LAB — LIPID PANEL
Chol/HDL Ratio: 4.1 ratio units (ref 0.0–4.4)
Cholesterol, Total: 202 mg/dL — ABNORMAL HIGH (ref 100–199)
HDL: 49 mg/dL (ref >39)
LDL Calculated: 125 mg/dL — ABNORMAL HIGH (ref 0–99)
Triglycerides: 139 mg/dL (ref 0–149)
VLDL CHOLESTEROL CAL: 28 mg/dL (ref 5–40)

## 2016-11-30 LAB — TB SKIN TEST
Induration: 0 mm
TB Skin Test: NEGATIVE

## 2016-12-06 ENCOUNTER — Encounter: Payer: Self-pay | Admitting: *Deleted

## 2017-01-10 ENCOUNTER — Ambulatory Visit (INDEPENDENT_AMBULATORY_CARE_PROVIDER_SITE_OTHER): Payer: Federal, State, Local not specified - PPO | Admitting: Family

## 2017-01-10 ENCOUNTER — Encounter: Payer: Self-pay | Admitting: Family

## 2017-01-10 VITALS — BP 121/71 | HR 76 | Temp 97.8°F | Wt 201.6 lb

## 2017-01-10 DIAGNOSIS — J069 Acute upper respiratory infection, unspecified: Secondary | ICD-10-CM | POA: Diagnosis not present

## 2017-01-10 MED ORDER — BENZONATATE 200 MG PO CAPS: 200.0000 mg | ORAL_CAPSULE | Freq: Three times a day (TID) | ORAL | 1 refills | Status: DC | PRN

## 2017-01-10 MED ORDER — DOXYCYCLINE HYCLATE 100 MG PO TABS
100.0000 mg | ORAL_TABLET | Freq: Two times a day (BID) | ORAL | 0 refills | Status: DC
Start: 2017-01-10 — End: 2017-03-26

## 2017-01-10 NOTE — Patient Instructions (Signed)
Upper Respiratory Infection, Adult Most upper respiratory infections (URIs) are a viral infection of the air passages leading to the lungs. A URI affects the nose, throat, and upper air passages. The most common type of URI is nasopharyngitis and is typically referred to as "the common cold." URIs run their course and usually go away on their own. Most of the time, a URI does not require medical attention, but sometimes a bacterial infection in the upper airways can follow a viral infection. This is called a secondary infection. Sinus and middle ear infections are common types of secondary upper respiratory infections. Bacterial pneumonia can also complicate a URI. A URI can worsen asthma and chronic obstructive pulmonary disease (COPD). Sometimes, these complications can require emergency medical care and may be life threatening. What are the causes? Almost all URIs are caused by viruses. A virus is a type of germ and can spread from one person to another. What increases the risk? You may be at risk for a URI if:  You smoke.  You have chronic heart or lung disease.  You have a weakened defense (immune) system.  You are very young or very old.  You have nasal allergies or asthma.  You work in crowded or poorly ventilated areas.  You work in health care facilities or schools.  What are the signs or symptoms? Symptoms typically develop 2-3 days after you come in contact with a cold virus. Most viral URIs last 7-10 days. However, viral URIs from the influenza virus (flu virus) can last 14-18 days and are typically more severe. Symptoms may include:  Runny or stuffy (congested) nose.  Sneezing.  Cough.  Sore throat.  Headache.  Fatigue.  Fever.  Loss of appetite.  Pain in your forehead, behind your eyes, and over your cheekbones (sinus pain).  Muscle aches.  How is this diagnosed? Your health care provider may diagnose a URI by:  Physical exam.  Tests to check that your  symptoms are not due to another condition such as: ? Strep throat. ? Sinusitis. ? Pneumonia. ? Asthma.  How is this treated? A URI goes away on its own with time. It cannot be cured with medicines, but medicines may be prescribed or recommended to relieve symptoms. Medicines may help:  Reduce your fever.  Reduce your cough.  Relieve nasal congestion.  Follow these instructions at home:  Take medicines only as directed by your health care provider.  Gargle warm saltwater or take cough drops to comfort your throat as directed by your health care provider.  Use a warm mist humidifier or inhale steam from a shower to increase air moisture. This may make it easier to breathe.  Drink enough fluid to keep your urine clear or pale yellow.  Eat soups and other clear broths and maintain good nutrition.  Rest as needed.  Return to work when your temperature has returned to normal or as your health care provider advises. You may need to stay home longer to avoid infecting others. You can also use a face mask and careful hand washing to prevent spread of the virus.  Increase the usage of your inhaler if you have asthma.  Do not use any tobacco products, including cigarettes, chewing tobacco, or electronic cigarettes. If you need help quitting, ask your health care provider. How is this prevented? The best way to protect yourself from getting a cold is to practice good hygiene.  Avoid oral or hand contact with people with cold symptoms.  Wash your   hands often if contact occurs.  There is no clear evidence that vitamin C, vitamin E, echinacea, or exercise reduces the chance of developing a cold. However, it is always recommended to get plenty of rest, exercise, and practice good nutrition. Contact a health care provider if:  You are getting worse rather than better.  Your symptoms are not controlled by medicine.  You have chills.  You have worsening shortness of breath.  You have  brown or red mucus.  You have yellow or brown nasal discharge.  You have pain in your face, especially when you bend forward.  You have a fever.  You have swollen neck glands.  You have pain while swallowing.  You have white areas in the back of your throat. Get help right away if:  You have severe or persistent: ? Headache. ? Ear pain. ? Sinus pain. ? Chest pain.  You have chronic lung disease and any of the following: ? Wheezing. ? Prolonged cough. ? Coughing up blood. ? A change in your usual mucus.  You have a stiff neck.  You have changes in your: ? Vision. ? Hearing. ? Thinking. ? Mood. This information is not intended to replace advice given to you by your health care provider. Make sure you discuss any questions you have with your health care provider. Document Released: 05/29/2001 Document Revised: 08/05/2016 Document Reviewed: 03/10/2014 Elsevier Interactive Patient Education  2017 [employer]  

## 2017-01-10 NOTE — Progress Notes (Signed)
Subjective:    Patient ID: Tricia Baker, female    DOB: [birth year], 66 y.o.   MRN: [id number]  Pt presents to the office today with recurrent cough. Pt was treated with Clindamycin on 12/12/16 and states she was feeling better, but now can feel her symptoms returning.  Cough  This is a recurrent problem. The current episode started 1 to 4 weeks ago. The problem has been waxing and waning. The problem occurs every few minutes. The cough is productive of sputum. Associated symptoms include chills, ear pain (left), headaches, nasal congestion, postnasal drip and rhinorrhea. Pertinent negatives include no ear congestion, fever, myalgias, sore throat or wheezing. The symptoms are aggravated by lying down. Risk factors for lung disease include smoking/tobacco exposure. She has tried OTC cough suppressant (clindamycin) for the symptoms. The treatment provided mild relief. There is no history of asthma or COPD.      Review of Systems  Constitutional: Positive for chills. Negative for fever.  HENT: Positive for ear pain (left), postnasal drip and rhinorrhea. Negative for sore throat.   Respiratory: Positive for cough. Negative for wheezing.   Musculoskeletal: Negative for myalgias.  Neurological: Positive for headaches.  All other systems reviewed and are negative.      Objective:   Physical Exam  Constitutional: She is oriented to person, place, and time. She appears well-developed and well-nourished. No distress.  HENT:  Head: Normocephalic and atraumatic.  Right Ear: External ear normal.  Left Ear: External ear normal.  Nose: Mucosal edema and rhinorrhea present.  Mouth/Throat: Posterior oropharyngeal erythema present.  Eyes: Pupils are equal, round, and reactive to light.  Neck: Normal range of motion. Neck supple. No thyromegaly present.  Cardiovascular: Normal rate, regular rhythm, normal heart sounds and intact distal pulses.   No murmur heard. Pulmonary/Chest: Effort normal  and breath sounds normal. No respiratory distress. She has no wheezes.  Intermittent nonproductive cough  Abdominal: Soft. Bowel sounds are normal. She exhibits no distension. There is no tenderness.  Musculoskeletal: Normal range of motion. She exhibits no edema or tenderness.  Neurological: She is alert and oriented to person, place, and time.  Skin: Skin is warm and dry.  Psychiatric: She has a normal mood and affect. Her behavior is normal. Judgment and thought content normal.  Vitals reviewed.     BP 121/71   Pulse 76   Temp 97.8 F (36.6 C) (Oral)   Wt 201 lb 9.6 oz (91.4 kg)   BMI 32.54 kg/m      Assessment & Plan:  1. Acute upper respiratory infection - Take meds as prescribed - Use a cool mist humidifier  -Use saline nose sprays frequently -Saline irrigations of the nose can be very helpful if done frequently.  * 4X daily for 1 week*  * Use of a nettie pot can be helpful with this. Follow directions with this* -Force fluids -For any cough or congestion  Use plain Mucinex- regular strength or max strength is fine   * Children- consult with Pharmacist for dosing -For fever or aces or pains- take tylenol or ibuprofen appropriate for age and weight.  * for fevers greater than 101 orally you may alternate ibuprofen and tylenol every  3 hours. -Throat lozenges if help - doxycycline (VIBRA-TABS) 100 MG tablet; Take 1 tablet (100 mg total) by mouth 2 (two) times daily.  Dispense: 20 tablet; Refill: 0 - benzonatate (TESSALON) 200 MG capsule; Take 1 capsule (200 mg total) by mouth 3 (three) times daily as  needed.  Dispense: 30 capsule; Refill: 1  Jannifer Rodney, FNP

## 2017-03-06 ENCOUNTER — Telehealth: Payer: Self-pay | Admitting: Physician Assistant

## 2017-03-06 NOTE — Telephone Encounter (Signed)
Patient aware and verbalizes understanding. 

## 2017-03-06 NOTE — Telephone Encounter (Signed)
Please advise 

## 2017-03-06 NOTE — Telephone Encounter (Signed)
Not sure why a reaction, may need to question specialist.  Apply ice packs 3-4 times a day

## 2017-03-26 ENCOUNTER — Ambulatory Visit (INDEPENDENT_AMBULATORY_CARE_PROVIDER_SITE_OTHER): Payer: Federal, State, Local not specified - PPO | Admitting: Physician Assistant

## 2017-03-26 ENCOUNTER — Encounter: Payer: Self-pay | Admitting: Physician Assistant

## 2017-03-26 VITALS — BP 101/65 | HR 72 | Temp 97.0°F | Ht 66.0 in | Wt 197.0 lb

## 2017-03-26 DIAGNOSIS — J029 Acute pharyngitis, unspecified: Secondary | ICD-10-CM | POA: Diagnosis not present

## 2017-03-26 DIAGNOSIS — L400 Psoriasis vulgaris: Secondary | ICD-10-CM | POA: Diagnosis not present

## 2017-03-26 LAB — RAPID STREP SCREEN (MED CTR MEBANE ONLY): STREP GP A AG, IA W/REFLEX: NEGATIVE

## 2017-03-26 LAB — CULTURE, GROUP A STREP

## 2017-03-26 MED ORDER — ERYTHROMYCIN BASE COATED 333 MG PO TBEC: 333.0000 mg | DELAYED_RELEASE_TABLET | Freq: Three times a day (TID) | ORAL | 0 refills | Status: DC

## 2017-03-26 MED ORDER — TRAMADOL HCL 50 MG PO TABS: 50.0000 mg | ORAL_TABLET | Freq: Three times a day (TID) | ORAL | 2 refills | Status: DC | PRN

## 2017-03-26 MED ORDER — ERYTHROMYCIN BASE COATED 500 MG PO TBEC: 500.0000 mg | DELAYED_RELEASE_TABLET | Freq: Three times a day (TID) | ORAL | 0 refills | Status: DC

## 2017-03-26 NOTE — Patient Instructions (Signed)

## 2017-03-26 NOTE — Progress Notes (Signed)
BP 101/65   Pulse 72   Temp 97 F (36.1 C) (Oral)   Ht  (1.676 m)   Wt 197 lb (89.4 kg)   BMI 31.80 kg/m    Subjective:    Patient ID: Tricia Baker, female    DOB: 08-09-1951, 66 y.o.   MRN: [id number]  HPI: Tricia Baker is a 66 y.o. female presenting on 03/26/2017 for Sore Throat  This patient has had less than 7 days sore throat that is worsening, chills, myalgias.  Complains of headache and postnasal drainage. There is copious drainage at times. Associated sore throat, decreased appetite and headache.  No flu ot strep exposure that she is aware of.  Relevant past medical, surgical, family and social history reviewed and updated as indicated. Allergies and medications reviewed and updated.  Past Medical History:  Diagnosis Date  . Arthritis   . Fluid retention   . Psoriasis   . Reflux     Past Surgical History:  Procedure Laterality Date  . ABDOMINAL HYSTERECTOMY    . APPENDECTOMY    . KNEE SURGERY    . SHOULDER SURGERY Right   . vocal cord surgery      Review of Systems  Constitutional: Negative.   HENT: Positive for congestion, ear pain, sore throat and trouble swallowing.   Eyes: Negative.   Respiratory: Negative.  Negative for shortness of breath and wheezing.   Cardiovascular: Negative for chest pain, palpitations and leg swelling.  Gastrointestinal: Negative.   Genitourinary: Negative.   Skin: Positive for color change, rash and wound.    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   Penicillins Anaphylaxis   Sulfa Antibiotics Anaphylaxis   Chromium Rash   Nickel Rash      Medication List       Accurate as of 03/26/17  3:11 PM. Always use your most recent med list.          albuterol 108 (90 Base) MCG/ACT inhaler Commonly known as:  PROVENTIL HFA;VENTOLIN HFA Inhale 2 puffs into the lungs every 6 (six) hours as  needed. For shortness of breath due to allergic reactions   celecoxib 200 MG capsule Commonly known as:  CELEBREX Take 1 capsule (200 mg total) by mouth 2 (two) times daily.   clobetasol ointment 0.05 % Commonly known as:  TEMOVATE APPLY TO AFFECTED AREAS ON FEET/PALMS TWICE A DAY, NOT TO FACE   Desoximetasone 0.25 % Liqd Apply 1 application topically 2 (two) times daily. To feet   erythromycin 500 MG EC tablet Commonly known as:  PCE Take 1 tablet (500 mg total) by mouth 3 (three) times daily.   esomeprazole 40 MG capsule Commonly known as:  NEXIUM Take 40 mg by mouth daily before breakfast.   famotidine 40 MG tablet Commonly known as:  PEPCID Take 40 mg by mouth daily.   hydrochlorothiazide 25 MG tablet Commonly known as:  HYDRODIURIL Take 25 mg by mouth every morning.   Ixekizumab 80 MG/ML Soaj Maintenance: Inject 1 pen ( ) [place] every twenty-nine days   topiramate 50 MG tablet Commonly known as:  TOPAMAX Take 2 tablets (100 mg total) by mouth 2 (two) times daily.   traMADol 50 MG tablet Commonly known as:  ULTRAM Take 1 tablet (50 mg total) by mouth every 8 (eight) hours as needed.   valACYclovir 500  MG tablet Commonly known as:  VALTREX Take 1 tablet (500 mg total) by mouth daily.   Vitamin D (Ergocalciferol) 50000 units Caps capsule Commonly known as:  DRISDOL Take 1 capsule (50,000 Units total) by mouth 2 (two) times a week.          Objective:    BP 101/65   Pulse 72   Temp 97 F (36. [id number]$  (1.676 m)   Wt 197 lb (89.4 kg)   BMI 31.80 kg/m   Allergies  Allergen Reactions  . 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  Marland Kitchen Penicillins Anaphylaxis  . Sulfa Antibiotics Anaphylaxis  . Chromium Rash  . Nickel Rash    Physical Exam  Constitutional: She is oriented to person, place, and time. She appears well-developed and well-nourished.  HENT:  Head:  Normocephalic and atraumatic.  Right Ear: A middle ear effusion is present.  Left Ear: A middle ear effusion is present.  Nose: Mucosal edema present. Right sinus exhibits no frontal sinus tenderness. Left sinus exhibits no frontal sinus tenderness.  Mouth/Throat: Oropharyngeal exudate, posterior oropharyngeal edema and posterior oropharyngeal erythema present. No tonsillar abscesses.    Eyes: Conjunctivae and EOM are normal. Pupils are equal, round, and reactive to light.  Neck: Normal range of motion.  Cardiovascular: Normal rate, regular rhythm, normal heart sounds and intact distal pulses.   Pulmonary/Chest: Effort normal and breath sounds normal.  Abdominal: Soft. Bowel sounds are normal.  Neurological: She is alert and oriented to person, place, and time. She has normal reflexes.  Skin: Skin is warm and dry. No rash noted.  Psychiatric: She has a normal mood and affect. Her behavior is normal. Judgment and thought content normal.  Nursing note and vitals reviewed.   Results for orders placed or performed in visit on 03/26/17  Rapid strep screen (not at [facility])  Result Value Ref Range   Strep Gp A Ag, IA W/Reflex Negative Negative  Culture, Group A Strep  Result Value Ref Range   Strep A Culture CANCELED       Assessment & Plan:   1. Sore throat - Rapid strep screen (not at [facility]) - erythromycin (PCE) 500 MG EC tablet; Take 1 tablet (500 mg total) by mouth 3 (three) times daily.  Dispense: 30 tablet; Refill: 0 - Culture, Group A Strep  2. Psoriasis vulgaris - Ixekizumab 80 MG/ML SOAJ; Maintenance: Inject 1 pen ( ) [place] every twenty-nine days - traMADol (ULTRAM) 50 MG tablet; Take 1 tablet (50 mg total) by mouth every 8 (eight) hours as needed.  Dispense: 90 tablet; Refill: 2   Continue all other maintenance medications as listed above.  Follow up plan: Return if symptoms worsen or fail to improve.  Educational handout given for pharyngitis  Remus Loffler  PA-C [facility] [address]  [place], [place] [address] [phone]   03/26/2017, 3:11 PM

## 2017-03-28 ENCOUNTER — Other Ambulatory Visit: Payer: Self-pay | Admitting: Family

## 2017-03-28 DIAGNOSIS — J069 Acute upper respiratory infection, unspecified: Secondary | ICD-10-CM

## 2017-05-20 ENCOUNTER — Ambulatory Visit (INDEPENDENT_AMBULATORY_CARE_PROVIDER_SITE_OTHER): Payer: Federal, State, Local not specified - PPO | Admitting: Physician Assistant

## 2017-05-20 ENCOUNTER — Encounter: Payer: Self-pay | Admitting: Physician Assistant

## 2017-05-20 VITALS — BP 94/65 | HR 67 | Temp 97.6°F | Ht 66.0 in | Wt 192.2 lb

## 2017-05-20 DIAGNOSIS — J029 Acute pharyngitis, unspecified: Secondary | ICD-10-CM | POA: Diagnosis not present

## 2017-05-20 DIAGNOSIS — J020 Streptococcal pharyngitis: Secondary | ICD-10-CM

## 2017-05-20 MED ORDER — ERYTHROMYCIN BASE 500 MG PO TABS
500.0000 mg | ORAL_TABLET | Freq: Four times a day (QID) | ORAL | 1 refills | Status: DC
Start: 2017-05-20 — End: 2017-09-11

## 2017-05-20 NOTE — Patient Instructions (Signed)
Rapid Strep Test Strep throat is a bacterial infection caused by the bacteria Streptococcus pyogenes. A rapid strep test is the quickest way to check if these bacteria are causing your sore throat. The test can be done at your health care provider's office. Results are usually ready in 10-20 minutes. You may have this test if you have symptoms of strep throat. These include:  A red throat with yellow or white spots.  Neck swelling and tenderness.  Fever.  Loss of appetite.  Trouble breathing or swallowing.  Rash.  Dehydration.  This test requires a sample of fluid from the back of your throat and tonsils. Your health care provider may hold down your tongue with a tongue depressor and use a swab to collect the sample. Your health care provider may collect a second sample at the same time. The second sample may be used for a throat culture. In a culture test, the sample is combined with a substance that encourages bacteria to grow. It takes longer to get the results of the throat culture test, but they are more accurate. They can confirm the results from a rapid strep test, or show that those results were wrong. What do the results mean? It is your responsibility to obtain your test results. Ask the lab or department performing the test when and how you will get your results. Contact your health care provider to discuss any questions you have about your results. The results of the rapid strep test will be negative or positive. Meaning of Negative Test Results If the result of your rapid strep test is negative, then it means:  It is likely that you do not have strep throat.  A virus may be causing your sore throat.  Your health care provider may do a throat culture to confirm the results of the rapid strep test. The throat culture can also identify the different strains of strep bacteria. Meaning of Positive Test Results If the result of your rapid strep test is positive, then it  means:  It is likely that you do have strep throat.  You may have to take antibiotics.  Your health care provider may do a throat culture to confirm the results of the rapid strep test. Strep throat usually requires a course of antibiotics. Talk with your health care provider to discuss your results, treatment options, and if necessary, the need for more tests. Talk with your health care provider if you have any questions about your results. This information is not intended to replace advice given to you by your health care provider. Make sure you discuss any questions you have with your health care provider. Document Released: 01/10/2005 Document Revised: 08/08/2016 Document Reviewed: 03/11/2014 Elsevier Interactive Patient Education  2017 [employer]  

## 2017-05-20 NOTE — Progress Notes (Signed)
BP 94/65   Pulse 67   Temp 97.6 F (36.4 C) (Oral)   Ht 5\' 6"  (1.676 m)   Wt 192 lb 3.2 oz (87.2 kg)   BMI 31.02 kg/m    Subjective:    Patient ID: Tricia Baker, female    DOB: 10/30/1951, 66 y.o.   MRN: [id number]  HPI: DARE SPILLMAN is a 66 y.o. female presenting on 05/20/2017 for Sore Throat (was on abx from derm started 5/21 is some better than it was last Monday but not much at all)  Patient has had another pharyngitis episode. She is not having any exposure that she knows of from her grandchildren. She's had significant pain and difficulty with swallowing. She is able to drink fluids. She completed a course of clindamycin from her dermatologist it did not seem to help the throat at all. She denies any fever chills nausea vomiting  Relevant past medical, surgical, family and social history reviewed and updated as indicated. Allergies and medications reviewed and updated.  Past Medical History:  Diagnosis Date  . Arthritis   . Fluid retention   . Psoriasis   . Reflux     Past Surgical History:  Procedure Laterality Date  . ABDOMINAL HYSTERECTOMY    . APPENDECTOMY    . KNEE SURGERY    . SHOULDER SURGERY Right   . vocal cord surgery      Review of Systems  Constitutional: Positive for fatigue. Negative for fever.  HENT: Positive for sore throat, trouble swallowing and voice change. Negative for ear pain, hearing loss and sinus pressure.   Eyes: Negative.   Respiratory: Negative.  Negative for shortness of breath.   Gastrointestinal: Negative.   Genitourinary: Negative.     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   Penicillins Anaphylaxis   Sulfa Antibiotics Anaphylaxis   Chromium Rash   Nickel Rash      Medication List       Accurate as of 05/20/17  8:35 AM. Always use your most recent med list.          albuterol 108 (90 Base)  MCG/ACT inhaler Commonly known as:  PROVENTIL HFA;VENTOLIN HFA Inhale 2 puffs into the lungs every 6 (six) hours as needed. For shortness of breath due to allergic reactions   benzonatate 200 MG capsule Commonly known as:  TESSALON Take 1 capsule (200 mg total) by mouth 3 (three) times daily as needed.   celecoxib 200 MG capsule Commonly known as:  CELEBREX Take 1 capsule (200 mg total) by mouth 2 (two) times daily.   clobetasol ointment 0.05 % Commonly known as:  TEMOVATE APPLY TO AFFECTED AREAS ON FEET/PALMS TWICE A DAY, NOT TO FACE   Desoximetasone 0.25 % Liqd Apply 1 application topically 2 (two) times daily. To feet   erythromycin base 500 MG tablet Commonly known as:  E-MYCIN Take 1 tablet (500 mg total) by mouth 4 (four) times daily.   esomeprazole 40 MG capsule Commonly known as:  NEXIUM Take 40 mg by mouth daily before breakfast.   famotidine 40 MG tablet Commonly known as:  PEPCID Take 40 mg by mouth daily.   hydrochlorothiazide 25 MG tablet Commonly known as:  HYDRODIURIL Take 25 mg by mouth every morning.   Ixekizumab 80 MG/ML Soaj Maintenance: Inject 1 pen (80mg ) [place] every twenty-nine days  topiramate 50 MG tablet Commonly known as:  TOPAMAX Take 2 tablets (100 mg total) by mouth 2 (two) times daily.   traMADol 50 MG tablet Commonly known as:  ULTRAM Take 1 tablet (50 mg total) by mouth every 8 (eight) hours as needed.   valACYclovir 500 MG tablet Commonly known as:  VALTREX Take 1 tablet (500 mg total) by mouth daily.   Vitamin D (Ergocalciferol) 50000 units Caps capsule Commonly known as:  DRISDOL Take 1 capsule (50,000 Units total) by mouth 2 (two) times a week.          Objective:    BP 94/65   Pulse 67   Temp 97.6 F (36.4 C) (Oral)   Ht 5\' 6"  (1.676 m)   Wt 192 lb 3.2 oz (87.2 kg)   BMI 31.02 kg/m   Allergies  Allergen Reactions  . Bee Venom Anaphylaxis and Other (See Comments)    Patient states that she has severe allergy to  all insects that sting.  Marland Kitchen Penicillins Anaphylaxis  . Sulfa Antibiotics Anaphylaxis  . Chromium Rash  . Nickel Rash    Physical Exam  Constitutional: She is oriented to person, place, and time. She appears well-developed and well-nourished.  HENT:  Head: Normocephalic and atraumatic.  Mouth/Throat: Mucous membranes are not dry. Oropharyngeal exudate, posterior oropharyngeal edema and posterior oropharyngeal erythema present. No tonsillar abscesses.  Eyes: Conjunctivae and EOM are normal. Pupils are equal, round, and reactive to light.  Cardiovascular: Normal rate, regular rhythm, normal heart sounds and intact distal pulses.   Pulmonary/Chest: Effort normal and breath sounds normal.  Abdominal: Soft. Bowel sounds are normal.  Neurological: She is alert and oriented to person, place, and time. She has normal reflexes.  Skin: Skin is warm and dry. No rash noted.  Psychiatric: She has a normal mood and affect. Her behavior is normal. Judgment and thought content normal.        Assessment & Plan:   1. Strep pharyngitis  2. Pharyngitis, unspecified etiology - erythromycin base (E-MYCIN) 500 MG tablet; Take 1 tablet (500 mg total) by mouth 4 (four) times daily.  Dispense: 40 tablet; Refill: 1   Current Outpatient Prescriptions:  .  albuterol (PROVENTIL HFA;VENTOLIN HFA) 108 (90 BASE) MCG/ACT inhaler, Inhale 2 puffs into the lungs every 6 (six) hours as needed. For shortness of breath due to allergic reactions, Disp: , Rfl:  .  celecoxib (CELEBREX) 200 MG capsule, Take 1 capsule (200 mg total) by mouth 2 (two) times daily., Disp: 60 capsule, Rfl: 6 .  clobetasol ointment (TEMOVATE) 0.05 %, APPLY TO AFFECTED AREAS ON FEET/PALMS TWICE A DAY, NOT TO FACE, Disp: , Rfl:  .  Desoximetasone 0.25 % LIQD, Apply 1 application topically 2 (two) times daily. To feet, Disp: , Rfl:  .  esomeprazole (NEXIUM) 40 MG capsule, Take 40 mg by mouth daily before breakfast., Disp: , Rfl:  .  famotidine  (PEPCID) 40 MG tablet, Take 40 mg by mouth daily., Disp: , Rfl:  .  hydrochlorothiazide (HYDRODIURIL) 25 MG tablet, Take 25 mg by mouth every morning., Disp: , Rfl:  .  Ixekizumab 80 MG/ML SOAJ, Maintenance: Inject 1 pen (80mg ) [place] every twenty-nine days, Disp: , Rfl:  .  topiramate (TOPAMAX) 50 MG tablet, Take 2 tablets (100 mg total) by mouth 2 (two) times daily., Disp: 120 tablet, Rfl: 6 .  traMADol (ULTRAM) 50 MG tablet, Take 1 tablet (50 mg total) by mouth every 8 (eight) hours as needed., Disp: 90 tablet, Rfl:  2 .  valACYclovir (VALTREX) 500 MG tablet, Take 1 tablet (500 mg total) by mouth daily., Disp: 30 tablet, Rfl: 11 .  Vitamin D, Ergocalciferol, (DRISDOL) 50000 units CAPS capsule, Take 1 capsule (50,000 Units total) by mouth 2 (two) times a week., Disp: 8 capsule, Rfl: 11 .  benzonatate (TESSALON) 200 MG capsule, Take 1 capsule (200 mg total) by mouth 3 (three) times daily as needed. (Patient not taking: Reported on 05/20/2017), Disp: 30 capsule, Rfl: 5 .  erythromycin base (E-MYCIN) 500 MG tablet, Take 1 tablet (500 mg total) by mouth 4 (four) times daily., Disp: 40 tablet, Rfl: 1 Continue all other maintenance medications as listed above.  Follow up plan: Return if symptoms worsen or fail to improve.  Educational handout given for strep  Remus LofflerAngel S. Arabela Basaldua PA-C [facility] [address]  [place], [place] [id number] [phone]   05/20/2017, 8:35 AM

## 2017-07-26 ENCOUNTER — Other Ambulatory Visit: Payer: Self-pay | Admitting: Physician Assistant

## 2017-08-13 ENCOUNTER — Other Ambulatory Visit: Payer: Self-pay | Admitting: Physician Assistant

## 2017-08-17 ENCOUNTER — Other Ambulatory Visit: Payer: Self-pay | Admitting: Physician Assistant

## 2017-08-20 ENCOUNTER — Other Ambulatory Visit: Payer: Self-pay | Admitting: Physician Assistant

## 2017-08-30 ENCOUNTER — Ambulatory Visit: Payer: Federal, State, Local not specified - PPO | Admitting: Physician Assistant

## 2017-09-02 ENCOUNTER — Encounter: Payer: Self-pay | Admitting: Physician Assistant

## 2017-09-03 ENCOUNTER — Other Ambulatory Visit: Payer: Self-pay | Admitting: Physician Assistant

## 2017-09-04 ENCOUNTER — Encounter: Payer: Self-pay | Admitting: Physician Assistant

## 2017-09-04 ENCOUNTER — Ambulatory Visit: Payer: Federal, State, Local not specified - PPO | Admitting: Physician Assistant

## 2017-09-11 ENCOUNTER — Encounter: Payer: Self-pay | Admitting: Physician Assistant

## 2017-09-11 ENCOUNTER — Ambulatory Visit (INDEPENDENT_AMBULATORY_CARE_PROVIDER_SITE_OTHER): Payer: Federal, State, Local not specified - PPO | Admitting: Physician Assistant

## 2017-09-11 VITALS — BP 107/69 | HR 65 | Temp 97.8°F | Ht 66.0 in | Wt 193.2 lb

## 2017-09-11 DIAGNOSIS — L400 Psoriasis vulgaris: Secondary | ICD-10-CM

## 2017-09-11 DIAGNOSIS — E782 Mixed hyperlipidemia: Secondary | ICD-10-CM | POA: Diagnosis not present

## 2017-09-11 DIAGNOSIS — H60331 Swimmer's ear, right ear: Secondary | ICD-10-CM | POA: Diagnosis not present

## 2017-09-11 DIAGNOSIS — Z23 Encounter for immunization: Secondary | ICD-10-CM

## 2017-09-11 MED ORDER — FAMOTIDINE 40 MG PO TABS: 40.0000 mg | ORAL_TABLET | Freq: Every day | ORAL | 11 refills | Status: AC

## 2017-09-11 MED ORDER — BUSPIRONE HCL 10 MG PO TABS: 10.0000 mg | ORAL_TABLET | Freq: Three times a day (TID) | ORAL | 5 refills | Status: DC

## 2017-09-11 MED ORDER — TRAMADOL HCL 50 MG PO TABS: 50.0000 mg | ORAL_TABLET | Freq: Three times a day (TID) | ORAL | 2 refills | Status: DC | PRN

## 2017-09-11 MED ORDER — EPINEPHRINE 0.3 MG/0.3ML IJ SOAJ: 0.3000 mg | Freq: Once | INTRAMUSCULAR | 6 refills | Status: DC

## 2017-09-11 MED ORDER — CIPROFLOXACIN-HYDROCORTISONE 0.2-1 % OT SUSP: 3.0000 [drp] | Freq: Two times a day (BID) | OTIC | 0 refills | Status: DC

## 2017-09-11 MED ORDER — TOPIRAMATE 50 MG PO TABS: 100.0000 mg | ORAL_TABLET | Freq: Two times a day (BID) | ORAL | 11 refills | Status: AC
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
## 2017-09-11 NOTE — Patient Instructions (Signed)
In a few days you may receive a survey in the mail or online from [employer] regarding your visit with us today. Please take a moment to fill this out. Your feedback is very important to our whole office. It can help us better understand your needs as well as improve your experience and satisfaction. Thank you for taking your time to complete it. We care about you.  Bertrum Helmstetter, PA-C  

## 2017-09-16 ENCOUNTER — Other Ambulatory Visit: Payer: Self-pay | Admitting: Physician Assistant

## 2017-11-18 ENCOUNTER — Other Ambulatory Visit: Payer: Self-pay | Admitting: Physician Assistant

## 2017-11-18 NOTE — Telephone Encounter (Signed)
No Vit D in EPIC 

## 2017-11-29 ENCOUNTER — Ambulatory Visit (INDEPENDENT_AMBULATORY_CARE_PROVIDER_SITE_OTHER): Payer: Federal, State, Local not specified - PPO | Admitting: Physician Assistant

## 2017-11-29 ENCOUNTER — Encounter: Payer: Self-pay | Admitting: Physician Assistant

## 2017-11-29 VITALS — BP 110/70 | HR 62 | Temp 97.1°F | Ht 66.0 in | Wt 198.6 lb

## 2017-11-29 DIAGNOSIS — E782 Mixed hyperlipidemia: Secondary | ICD-10-CM

## 2017-11-29 DIAGNOSIS — J0100 Acute maxillary sinusitis, unspecified: Secondary | ICD-10-CM

## 2017-11-29 DIAGNOSIS — M150 Primary generalized (osteo)arthritis: Secondary | ICD-10-CM

## 2017-11-29 DIAGNOSIS — J019 Acute sinusitis, unspecified: Secondary | ICD-10-CM | POA: Insufficient documentation

## 2017-11-29 DIAGNOSIS — L400 Psoriasis vulgaris: Secondary | ICD-10-CM

## 2017-11-29 DIAGNOSIS — Z0000 Encounter for general adult medical examination without abnormal findings: Secondary | ICD-10-CM

## 2017-11-29 DIAGNOSIS — M159 Polyosteoarthritis, unspecified: Secondary | ICD-10-CM

## 2017-11-29 DIAGNOSIS — J189 Pneumonia, unspecified organism: Secondary | ICD-10-CM

## 2017-11-29 MED ORDER — CELECOXIB 200 MG PO CAPS: 200.0000 mg | ORAL_CAPSULE | Freq: Two times a day (BID) | ORAL | 11 refills | Status: AC

## 2017-11-29 MED ORDER — BENZONATATE 200 MG PO CAPS: 200.0000 mg | ORAL_CAPSULE | Freq: Two times a day (BID) | ORAL | 0 refills | Status: DC | PRN

## 2017-11-29 MED ORDER — HYDROCOD POLST-CPM POLST ER 10-8 MG/5ML PO SUER: 5.0000 mL | Freq: Two times a day (BID) | ORAL | 0 refills | Status: DC | PRN

## 2017-11-29 MED ORDER — DOXYCYCLINE HYCLATE 100 MG PO TABS: 100.0000 mg | ORAL_TABLET | Freq: Two times a day (BID) | ORAL | 0 refills | Status: DC

## 2017-11-29 MED ORDER — TRAMADOL HCL 50 MG PO TABS: 50.0000 mg | ORAL_TABLET | Freq: Three times a day (TID) | ORAL | 2 refills | Status: AC | PRN

## 2017-11-29 MED ORDER — VALACYCLOVIR HCL 500 MG PO TABS: 500.0000 mg | ORAL_TABLET | Freq: Every day | ORAL | 11 refills | Status: AC

## 2017-11-29 NOTE — Progress Notes (Signed)
BP 110/70   Pulse 62   Temp (!) 97.1 F (36.2 C) (Oral)   Ht '5\' 6"'$  (1.676 m)   Wt 198 lb 9.6 oz (90.1 kg)   BMI 32.05 kg/m    Subjective:    Patient ID: Tricia Baker, female    DOB: Feb 08, 1951, 66 y.o.   MRN: [id number]  HPI: Tricia Baker is a 66 y.o. female presenting on 11/29/2017 for Annual Exam and stopped up  This patient comes in for annual well physical examination. All medications are reviewed today. There are no reports of any problems with the medications. All of the medical conditions are reviewed and updated.  Lab work is reviewed and will be ordered as medically necessary. There are no new problems reported with today's visit.  Patient reports doing well overall.  Due to her severe psoriasis.  She takes she brings out with shingles frequently.  Questioned taking the Shingrix now, will contact Lyman Speller about it.  Also needs annual labs.  Relevant past medical, surgical, family and social history reviewed and updated as indicated. Allergies and medications reviewed and updated.  Past Medical History:  Diagnosis Date  . Arthritis   . Fluid retention   . Psoriasis   . Reflux     Past Surgical History:  Procedure Laterality Date  . ABDOMINAL HYSTERECTOMY    . APPENDECTOMY    . KNEE SURGERY    . SHOULDER SURGERY Right   . vocal cord surgery      Review of Systems  Constitutional: Negative.  Negative for activity change, fatigue and fever.  HENT: Negative.   Eyes: Negative.   Respiratory: Negative.  Negative for cough.   Cardiovascular: Negative.  Negative for chest pain.  Gastrointestinal: Negative.  Negative for abdominal pain.  Endocrine: Negative.   Genitourinary: Negative.  Negative for dysuria.  Musculoskeletal: Positive for arthralgias, gait problem, joint swelling and myalgias.  Skin: Positive for color change and rash.    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   Penicillins Anaphylaxis   Sulfa Antibiotics Anaphylaxis   Chromium Rash   Nickel Rash      Medication List        Accurate as of 11/29/17 11:38 AM. Always use your most recent med list.          albuterol 108 (90 Base) MCG/ACT inhaler Commonly known as:  PROVENTIL HFA;VENTOLIN HFA Inhale 2 puffs into the lungs every 6 (six) hours as needed. For shortness of breath due to allergic reactions   benzonatate 200 MG capsule Commonly known as:  TESSALON Take 1 capsule (200 mg total) by mouth 2 (two) times daily as needed for cough.   busPIRone 10 MG tablet Commonly known as:  BUSPAR TAKE 1/2 TO 1 TABLET TWICE DAILY AS NEEDED FOR ANXIETY   celecoxib 200 MG capsule Commonly known as:  CELEBREX Take 1 capsule (200 mg total) by mouth 2 (two) times daily.   chlorpheniramine-HYDROcodone 10-8 MG/5ML Suer Commonly known as:  TUSSIONEX Take 5 mLs by mouth every 12 (twelve) hours as needed for cough.   clobetasol ointment 0.05 % Commonly known as:  TEMOVATE APPLY TO AFFECTED AREAS ON FEET/PALMS TWICE A DAY, NOT TO FACE   Desoximetasone 0.25 % Liqd Apply 1 application topically 2 (two) times daily. To feet   doxycycline 100 MG tablet Commonly known  as:  VIBRA-TABS Take 1 tablet (100 mg total) by mouth 2 (two) times daily. 1 po bid   esomeprazole 40 MG capsule Commonly known as:  NEXIUM Take 40 mg by mouth daily before breakfast.   famotidine 40 MG tablet Commonly known as:  PEPCID Take 1 tablet (40 mg total) by mouth daily.   hydrochlorothiazide 25 MG tablet Commonly known as:  HYDRODIURIL TAKE 1 TABLET DAILY   meloxicam 7.5 MG tablet Commonly known as:  MOBIC TAKE 1 TABLET DAILY   topiramate 50 MG tablet Commonly known as:  TOPAMAX Take 2 tablets (100 mg total) by mouth 2 (two) times daily.   traMADol 50 MG tablet Commonly known as:  ULTRAM Take 1 tablet (50 mg total) by mouth every 8 (eight) hours as needed.     TREMFYA 100 MG/ML Sosy Generic drug:  Guselkumab INJECT '100MG'$  UNDER THE SKIN AT WEEK 0   valACYclovir 500 MG tablet Commonly known as:  VALTREX Take 1 tablet (500 mg total) by mouth daily.   Vitamin D (Ergocalciferol) 50000 units Caps capsule Commonly known as:  DRISDOL TAKE (1) CAPSULE TWICE A WEEK.          Objective:    BP 110/70   Pulse 62   Temp (!) 97.1 F (36.2 C) (Oral)   Ht '5\' 6"'$  (1.676 m)   Wt 198 lb 9.6 oz (90.1 kg)   BMI 32.05 kg/m   Allergies  Allergen Reactions  . 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  Marland Kitchen Penicillins Anaphylaxis  . Sulfa Antibiotics Anaphylaxis  . Chromium Rash  . Nickel Rash    Physical Exam  Constitutional: She is oriented to person, place, and time. She appears well-developed and well-nourished.  HENT:  Head: Normocephalic and atraumatic.  Right Ear: Tympanic membrane, external ear and ear canal normal.  Left Ear: Tympanic membrane, external ear and ear canal normal.  Nose: Nose normal. No rhinorrhea.  Mouth/Throat: Oropharynx is clear and moist and mucous membranes are normal. No oropharyngeal exudate or posterior oropharyngeal erythema.  Eyes: Conjunctivae and EOM are normal. Pupils are equal, round, and reactive to light.  Neck: Normal range of motion. Neck supple.  Cardiovascular: Normal rate, regular rhythm, normal heart sounds and intact distal pulses.  Pulmonary/Chest: Effort normal and breath sounds normal.  Abdominal: Soft. Bowel sounds are normal.  Neurological: She is alert and oriented to person, place, and time. She has normal reflexes.  Skin: Skin is warm and dry. No rash noted.  Psychiatric: She has a normal mood and affect. Her behavior is normal. Judgment and thought content normal.  Nursing note and vitals reviewed.       Assessment & Plan:   1. Annual physical exam - CBC with Differential/Platelet - CMP14+EGFR - Lipid  panel - TSH - Quantiferon tb gold assay - QuantiFERON-TB Gold Plus  2. Acute non-recurrent maxillary sinusitis  3. Psoriasis vulgaris - traMADol (ULTRAM) 50 MG tablet; Take 1 tablet (50 mg total) by mouth every 8 (eight) hours as needed.  Dispense: 90 tablet; Refill: 2 - celecoxib (CELEBREX) 200 MG capsule; Take 1 capsule (200 mg total) by mouth 2 (two) times daily.  Dispense: 60 capsule; Refill: 11 - Quantiferon tb gold assay - QuantiFERON-TB Gold Plus  4. Mixed hyperlipidemia  5. Primary osteoarthritis involving multiple joints - celecoxib (CELEBREX) 200 MG capsule; Take 1 capsule (200 mg total) by mouth 2 (two) times daily.  Dispense: 60 capsule; Refill: 11  6. Walking pneumonia - doxycycline (VIBRA-TABS) 100 MG tablet; Take 1 tablet (100 mg total) by mouth 2 (two) times daily. 1 po bid  Dispense: 20 tablet; Refill: 0 - benzonatate (TESSALON) 200 MG capsule; Take 1 capsule (200 mg total) by mouth 2 (two) times daily as needed for cough.  Dispense: 40 capsule; Refill: 0 - chlorpheniramine-HYDROcodone (TUSSIONEX) 10-8 MG/5ML SUER; Take 5 mLs by mouth every 12 (twelve) hours as needed for cough.  Dispense: 120 mL; Refill: 0    Current Outpatient Medications:  .  albuterol (PROVENTIL HFA;VENTOLIN HFA) 108 (90 BASE) MCG/ACT inhaler, Inhale 2 puffs into the lungs every 6 (six) hours as needed. For shortness of breath due to allergic reactions, Disp: , Rfl:  .  benzonatate (TESSALON) 200 MG capsule, Take 1 capsule (200 mg total) by mouth 2 (two) times daily as needed for cough., Disp: 40 capsule, Rfl: 0 .  busPIRone (BUSPAR) 10 MG tablet, TAKE 1/2 TO 1 TABLET TWICE DAILY AS NEEDED FOR ANXIETY, Disp: 60 tablet, Rfl: 5 .  celecoxib (CELEBREX) 200 MG capsule, Take 1 capsule (200 mg total) by mouth 2 (two) times daily., Disp: 60 capsule, Rfl: 11 .  chlorpheniramine-HYDROcodone (TUSSIONEX) 10-8 MG/5ML SUER, Take 5 mLs by mouth every 12 (twelve) hours as needed for cough., Disp: 120 mL, Rfl: 0 .   clobetasol ointment (TEMOVATE) 0.05 %, APPLY TO AFFECTED AREAS ON FEET/PALMS TWICE A DAY, NOT TO FACE, Disp: , Rfl:  .  Desoximetasone 0.25 % LIQD, Apply 1 application topically 2 (two) times daily. To feet, Disp: , Rfl:  .  doxycycline (VIBRA-TABS) 100 MG tablet, Take 1 tablet (100 mg total) by mouth 2 (two) times daily. 1 po bid, Disp: 20 tablet, Rfl: 0 .  esomeprazole (NEXIUM) 40 MG capsule, Take 40 mg by mouth daily before breakfast., Disp: , Rfl:  .  famotidine (PEPCID) 40 MG tablet, Take 1 tablet (40 mg total) by mouth daily., Disp: 30 tablet, Rfl: 11 .  hydrochlorothiazide (HYDRODIURIL) 25 MG tablet, TAKE 1 TABLET DAILY, Disp: 30 tablet, Rfl: 5 .  meloxicam (MOBIC) 7.5 MG tablet, TAKE 1 TABLET DAILY, Disp: 30 tablet, Rfl: 5 .  topiramate (TOPAMAX) 50 MG tablet, Take 2 tablets (100 mg total) by mouth 2 (two) times daily., Disp: 120 tablet, Rfl: 11 .  traMADol (ULTRAM) 50 MG tablet, Take 1 tablet (50 mg total) by mouth every 8 (eight) hours as needed., Disp: 90 tablet, Rfl: 2 .  TREMFYA 100 MG/ML SOSY, INJECT '100MG'$  UNDER THE SKIN AT WEEK 0, Disp: , Rfl: 0 .  valACYclovir (VALTREX) 500 MG tablet, Take 1 tablet (500 mg total) by mouth daily., Disp: 30 tablet, Rfl: 11 .  Vitamin D, Ergocalciferol, (DRISDOL) 50000 units CAPS capsule, TAKE (1) CAPSULE TWICE A WEEK., Disp: 8 capsule, Rfl: 11 Continue all other maintenance medications as listed above.  Follow up plan: Return in about 1 year (around 11/29/2018) for well.  Educational handout given for Bowers PA-C [facility] [address]  [place], [place] [address] [phone]   11/29/2017, 11:38 AM

## 2017-11-29 NOTE — Patient Instructions (Signed)
In a few days you may receive a survey in the mail or online from [employer] regarding your visit with us today. Please take a moment to fill this out. Your feedback is very important to our whole office. It can help us better understand your needs as well as improve your experience and satisfaction. Thank you for taking your time to complete it. We care about you.  Lashonta Pilling, PA-C  

## 2017-12-03 LAB — CMP14+EGFR
ALBUMIN: 4.3 g/dL (ref 3.6–4.8)
ALT: 24 IU/L (ref 0–32)
AST: 18 IU/L (ref 0–40)
Albumin/Globulin Ratio: 1.7 (ref 1.2–2.2)
Alkaline Phosphatase: 91 IU/L (ref 39–117)
BUN / CREAT RATIO: 16 (ref 12–28)
BUN: 13 mg/dL (ref 8–27)
Bilirubin Total: 0.6 mg/dL (ref 0.0–1.2)
CALCIUM: 9.4 mg/dL (ref 8.7–10.3)
CO2: 23 mmol/L (ref 20–29)
CREATININE: 0.79 mg/dL (ref 0.57–1.00)
Chloride: 105 mmol/L (ref 96–106)
GFR calc Af Amer: 91 mL/min/{1.73_m2} (ref >59)
GFR, EST NON AFRICAN AMERICAN: 79 mL/min/{1.73_m2} (ref >59)
GLOBULIN, TOTAL: 2.5 g/dL (ref 1.5–4.5)
Glucose: 89 mg/dL (ref 65–99)
Potassium: 3.8 mmol/L (ref 3.5–5.2)
SODIUM: 142 mmol/L (ref 134–144)
TOTAL PROTEIN: 6.8 g/dL (ref 6.0–8.5)

## 2017-12-03 LAB — CBC WITH DIFFERENTIAL/PLATELET
Basophils Absolute: 0 10*3/uL (ref 0.0–0.2)
Basos: 0 %
EOS (ABSOLUTE): 0.3 10*3/uL (ref 0.0–0.4)
EOS: 3 %
HEMATOCRIT: 41.7 % (ref 34.0–46.6)
HEMOGLOBIN: 14.1 g/dL (ref 11.1–15.9)
IMMATURE GRANULOCYTES: 0 %
Immature Grans (Abs): 0 10*3/uL (ref 0.0–0.1)
LYMPHS ABS: 2.3 10*3/uL (ref 0.7–3.1)
LYMPHS: 25 %
MCH: 31.8 pg (ref 26.6–33.0)
MCHC: 33.8 g/dL (ref 31.5–35.7)
MCV: 94 fL (ref 79–97)
Monocytes Absolute: 0.7 10*3/uL (ref 0.1–0.9)
Monocytes: 8 %
Neutrophils Absolute: 5.9 10*3/uL (ref 1.4–7.0)
Neutrophils: 64 %
Platelets: 218 10*3/uL (ref 150–379)
RBC: 4.44 x10E6/uL (ref 3.77–5.28)
RDW: 13.9 % (ref 12.3–15.4)
WBC: 9.2 10*3/uL (ref 3.4–10.8)

## 2017-12-03 LAB — LIPID PANEL
CHOL/HDL RATIO: 3.5 ratio (ref 0.0–4.4)
Cholesterol, Total: 198 mg/dL (ref 100–199)
HDL: 57 mg/dL (ref >39)
LDL CALC: 116 mg/dL — AB (ref 0–99)
Triglycerides: 127 mg/dL (ref 0–149)
VLDL Cholesterol Cal: 25 mg/dL (ref 5–40)

## 2017-12-03 LAB — QUANTIFERON-TB GOLD PLUS
QUANTIFERON NIL VALUE: 0.02 [IU]/mL
QUANTIFERON TB2 AG VALUE: 0.02 [IU]/mL
QUANTIFERON-TB GOLD PLUS: NEGATIVE
QuantiFERON TB1 Ag Value: 0.02 IU/mL

## 2017-12-03 LAB — TSH: TSH: 3.51 u[IU]/mL (ref 0.450–4.500)

## 2017-12-19 ENCOUNTER — Other Ambulatory Visit: Payer: Self-pay | Admitting: Physician Assistant

## 2018-01-15 ENCOUNTER — Telehealth: Payer: Self-pay | Admitting: Physician Assistant

## 2018-01-16 ENCOUNTER — Other Ambulatory Visit: Payer: Self-pay | Admitting: Physician Assistant

## 2018-01-16 MED ORDER — CYCLOBENZAPRINE HCL 10 MG PO TABS: 10.0000 mg | ORAL_TABLET | Freq: Three times a day (TID) | ORAL | 0 refills | Status: AC | PRN

## 2018-01-16 NOTE — Telephone Encounter (Signed)
It has been sent!

## 2018-01-16 NOTE — Telephone Encounter (Signed)
Please check with her about this

## 2018-01-16 NOTE — Telephone Encounter (Signed)
pt aware 

## 2018-01-16 NOTE — Telephone Encounter (Signed)
She pulled a muscle  - one spot is raised (knot about 1/5 in long) In groin/ hip area.  First noticed last week - she thought it was getting better and pulled it again. Worse now. Standing up is very painful.   She is thinking she needs a muscle relaxer = she is on the celebrex PRN, mobic QD, and tramadol regularly.

## 2018-01-31 ENCOUNTER — Encounter: Payer: Self-pay | Admitting: Family Medicine

## 2018-01-31 ENCOUNTER — Ambulatory Visit: Payer: Federal, State, Local not specified - PPO | Admitting: Family Medicine

## 2018-01-31 VITALS — BP 118/79 | HR 78 | Temp 97.5°F | Ht 66.0 in | Wt 198.0 lb

## 2018-01-31 DIAGNOSIS — R590 Localized enlarged lymph nodes: Secondary | ICD-10-CM | POA: Diagnosis not present

## 2018-01-31 MED ORDER — HYDROCOD POLST-CPM POLST ER 10-8 MG/5ML PO SUER: 5.0000 mL | Freq: Two times a day (BID) | ORAL | 0 refills | Status: DC | PRN

## 2018-01-31 MED ORDER — HYDROCOD POLST-CPM POLST ER 10-8 MG/5ML PO SUER: 5.0000 mL | Freq: Two times a day (BID) | ORAL | 0 refills | Status: AC | PRN

## 2018-01-31 MED ORDER — DOXYCYCLINE HYCLATE 100 MG PO TABS: 100.0000 mg | ORAL_TABLET | Freq: Two times a day (BID) | ORAL | 0 refills | Status: AC

## 2018-01-31 NOTE — Progress Notes (Signed)
BP 118/79   Pulse 78   Temp (!) 97.5 F (36.4 C) (Oral)   Ht 5\' 6"  (1.676 m)   Wt 198 lb (89.8 kg)   BMI 31.96 kg/m    Subjective:    Patient ID: Tricia Baker, female    DOB: 1951-07-20, 67 y.o.   MRN: [id number]  HPI: WREATHA STURGEON is a 67 y.o. female presenting on 01/31/2018 for Painful knot on upper right leg   HPI Pain and lump in groin of right leg Patient has pain and a lump that she is noticed in the groin of her right leg that is been there about a couple weeks.  She says she felt the nodule initially and then she took some tramadol which helped a little bit with the pain and then she took some antibiotic that she had left over that she cannot recall the name of.  She says she took 4 pills of it but this was over a week ago and it did help reduce the size of the lump and help reduce the pain but then it is come back and she feels like it is larger than what it was before now.  She has been using heat which does help reduce the pain.  She says the pain is worse with laying especially on that side and moving or twisting of her torso.  She says that a muscle relaxer and resting on the other side and heat have been helping.  She denies any fevers or chills or redness or warmth.  She does feel that the pain is shooting down into her groin on that right leg is a palpable lump.  Relevant past medical, surgical, family and social history reviewed and updated as indicated. Interim medical history since our last visit reviewed. Allergies and medications reviewed and updated.  Review of Systems  Constitutional: Negative for chills and fever.  Eyes: Negative for visual disturbance.  Respiratory: Negative for chest tightness and shortness of breath.   Cardiovascular: Negative for chest pain and leg swelling.  Musculoskeletal: Negative for back pain and gait problem.  Skin: Negative for color change and rash.  Neurological: Negative for light-headedness and headaches.    Psychiatric/Behavioral: Negative for agitation and behavioral problems.  All other systems reviewed and are negative.   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   Penicillins Anaphylaxis   Sulfa Antibiotics Anaphylaxis   Yellow Dyes (non-tartrazine)    Chromium Rash   Nickel Rash      Medication List        Accurate as of 01/31/18  9:12 AM. Always use your most recent med list.          albuterol 108 (90 Base) MCG/ACT inhaler Commonly known as:  PROVENTIL HFA;VENTOLIN HFA Inhale 2 puffs into the lungs every 6 (six) hours as needed. For shortness of breath due to allergic reactions   busPIRone 10 MG tablet Commonly known as:  BUSPAR TAKE 1/2 TO 1 TABLET TWICE DAILY AS NEEDED FOR ANXIETY   celecoxib 200 MG capsule Commonly known as:  CELEBREX Take 1 capsule (200 mg total) by mouth 2 (two) times daily.   chlorpheniramine-HYDROcodone 10-8 MG/5ML Suer Commonly known as:  TUSSIONEX Take 5 mLs by mouth every 12 (twelve) hours as needed for cough.  clobetasol ointment 0.05 % Commonly known as:  TEMOVATE APPLY TO AFFECTED AREAS ON FEET/PALMS TWICE A DAY, NOT TO FACE   cyclobenzaprine 10 MG tablet Commonly known as:  FLEXERIL Take 1 tablet (10 mg total) by mouth 3 (three) times daily as needed for muscle spasms.   Desoximetasone 0.25 % Liqd Apply 1 application topically 2 (two) times daily. To feet   doxycycline 100 MG tablet Commonly known as:  VIBRA-TABS Take 1 tablet (100 mg total) by mouth 2 (two) times daily. 1 po bid   esomeprazole 40 MG capsule Commonly known as:  NEXIUM Take 40 mg by mouth daily before breakfast.   famotidine 40 MG tablet Commonly known as:  PEPCID Take 1 tablet (40 mg total) by mouth daily.   hydrochlorothiazide 25 MG tablet Commonly known as:  HYDRODIURIL TAKE 1 TABLET  DAILY   meloxicam 7.5 MG tablet Commonly known as:  MOBIC TAKE 1 TABLET DAILY   topiramate 50 MG tablet Commonly known as:  TOPAMAX Take 2 tablets (100 mg total) by mouth 2 (two) times daily.   traMADol 50 MG tablet Commonly known as:  ULTRAM Take 1 tablet (50 mg total) by mouth every 8 (eight) hours as needed.   TREMFYA 100 MG/ML Sosy Generic drug:  Guselkumab INJECT 100MG  UNDER THE SKIN AT WEEK 0   valACYclovir 500 MG tablet Commonly known as:  VALTREX Take 1 tablet (500 mg total) by mouth daily.   valACYclovir 1000 MG tablet Commonly known as:  VALTREX TAKE 1 TABLET 4 TIMES A DAY FOR 10 DAYS   Vitamin D (Ergocalciferol) 50000 units Caps capsule Commonly known as:  DRISDOL TAKE (1) CAPSULE TWICE A WEEK.          Objective:    BP 118/79   Pulse 78   Temp (!) 97.5 F (36.4 C) (Oral)   Ht 5\' 6"  (1.676 m)   Wt 198 lb (89.8 kg)   BMI 31.96 kg/m   Wt Readings from Last 3 Encounters:  01/31/18 198 lb (89.8 kg)  11/29/17 198 lb 9.6 oz (90.1 kg)  09/11/17 193 lb 3.2 oz (87.6 kg)    Physical Exam  Constitutional: She is oriented to person, place, and time. She appears well-developed and well-nourished. No distress.  Eyes: Conjunctivae are normal.  Cardiovascular: Normal rate, regular rhythm, normal heart sounds and intact distal pulses.  No murmur heard. Pulmonary/Chest: Effort normal and breath sounds normal. No respiratory distress. She has no wheezes.  Musculoskeletal: Normal range of motion. She exhibits no edema or tenderness.  Lymphadenopathy:       Right: Inguinal (Small palpable mobile right inguinal nodule, tender to palpation, no overlying skin changes, will treat with antibiotic.) adenopathy present.  Neurological: She is alert and oriented to person, place, and time. Coordination normal.  Skin: Skin is warm and dry. No rash noted. She is not diaphoretic.  Psychiatric: She has a normal mood and affect. Her behavior is normal.  Nursing note and vitals  reviewed.     Assessment & Plan:   Problem List Items Addressed This Visit    None    Visit Diagnoses    Lymphadenopathy, inguinal    -  Primary   Relevant Medications   doxycycline (VIBRA-TABS) 100 MG tablet   chlorpheniramine-HYDROcodone (TUSSIONEX) 10-8 MG/5ML SUER       Follow up plan: Return if symptoms worsen or fail to improve.  Counseling provided for all of the vaccine components No orders of the defined types were placed in  this encounter.   Arville CareJoshua Kiowa Peifer, MD [facility] 01/31/2018, 9:12 AM

## 2018-02-06 ENCOUNTER — Telehealth: Payer: Self-pay | Admitting: Physician Assistant

## 2018-02-07 NOTE — Telephone Encounter (Signed)
It looks like you were treated for some lymphadenopathy with doxycycline.  This is tissue antibiotic.  We could also try Keflex it is no tissue antibiotic is this something that she could take?

## 2018-02-11 ENCOUNTER — Other Ambulatory Visit: Payer: Self-pay | Admitting: Physician Assistant

## 2018-02-11 MED ORDER — HYDROCODONE-ACETAMINOPHEN 10-325 MG PO TABS: 1.0000 | ORAL_TABLET | Freq: Four times a day (QID) | ORAL | 0 refills | Status: AC | PRN

## 2018-02-11 MED ORDER — CEPHALEXIN 500 MG PO CAPS: 500.0000 mg | ORAL_CAPSULE | Freq: Four times a day (QID) | ORAL | 0 refills | Status: AC

## 2018-02-11 NOTE — Telephone Encounter (Signed)
Patient said she is not sure if she has ever taken Keflex but she would like to try it, her pharmacy is [facility].  She said she has been taking Tramadol for the pain but it is not helping at all.  She would like to know if you will send in something a little stronger for a few days.

## 2018-02-11 NOTE — Telephone Encounter (Signed)
Patient aware that meds have been sent to pharmacy

## 2018-02-11 NOTE — Telephone Encounter (Signed)
Sent meds.

## 2018-02-14 ENCOUNTER — Other Ambulatory Visit: Payer: Self-pay | Admitting: Physician Assistant

## 2018-02-27 ENCOUNTER — Other Ambulatory Visit: Payer: Self-pay | Admitting: Physician Assistant

## 2018-03-03 ENCOUNTER — Other Ambulatory Visit: Payer: Self-pay | Admitting: Family Medicine

## 2018-03-03 DIAGNOSIS — R2241 Localized swelling, mass and lump, right lower limb: Secondary | ICD-10-CM

## 2018-03-06 ENCOUNTER — Ambulatory Visit
Admission: RE | Admit: 2018-03-06 | Discharge: 2018-03-06 | Disposition: A | Discharge status: Home / Self Care | Payer: Federal, State, Local not specified - PPO | Source: Ambulatory Visit | Attending: Family Medicine | Admitting: Family Medicine

## 2018-03-06 DIAGNOSIS — R2241 Localized swelling, mass and lump, right lower limb: Secondary | ICD-10-CM

## 2018-03-11 ENCOUNTER — Other Ambulatory Visit: Payer: Self-pay | Admitting: Family Medicine

## 2018-03-11 DIAGNOSIS — R229 Localized swelling, mass and lump, unspecified: Principal | ICD-10-CM

## 2018-03-11 DIAGNOSIS — IMO0002 Reserved for concepts with insufficient information to code with codable children: Secondary | ICD-10-CM

## 2018-03-19 ENCOUNTER — Inpatient Hospital Stay
Admission: RE | Admit: 2018-03-19 | Discharge: 2018-03-19 | Disposition: A | Discharge status: Home / Self Care | Payer: Federal, State, Local not specified - PPO | Source: Ambulatory Visit | Attending: Family Medicine | Admitting: Family Medicine

## 2018-03-22 ENCOUNTER — Ambulatory Visit
Admission: RE | Admit: 2018-03-22 | Discharge: 2018-03-22 | Disposition: A | Discharge status: Home / Self Care | Payer: Federal, State, Local not specified - PPO | Source: Ambulatory Visit | Attending: Family Medicine | Admitting: Family Medicine

## 2018-03-22 DIAGNOSIS — IMO0002 Reserved for concepts with insufficient information to code with codable children: Secondary | ICD-10-CM

## 2018-03-22 DIAGNOSIS — R229 Localized swelling, mass and lump, unspecified: Principal | ICD-10-CM

## 2018-03-22 MED ORDER — GADOBENATE DIMEGLUMINE 529 MG/ML IV SOLN
18.0000 mL | Freq: Once | INTRAVENOUS | Status: AC | PRN
  Administered 2018-03-22: 18 mL via INTRAVENOUS

## 2018-04-30 ENCOUNTER — Other Ambulatory Visit: Payer: Self-pay | Admitting: *Deleted

## 2018-04-30 MED ORDER — EPINEPHRINE 0.3 MG/0.3ML IJ SOAJ: 0.3000 mg | Freq: Once | INTRAMUSCULAR | 6 refills | Status: AC

## 2018-05-05 ENCOUNTER — Encounter: Payer: Self-pay | Admitting: Physical Therapy

## 2018-05-05 ENCOUNTER — Ambulatory Visit: Payer: Federal, State, Local not specified - PPO | Attending: Student | Admitting: Physical Therapy

## 2018-05-05 DIAGNOSIS — R262 Difficulty in walking, not elsewhere classified: Secondary | ICD-10-CM

## 2018-05-05 DIAGNOSIS — M25651 Stiffness of right hip, not elsewhere classified: Secondary | ICD-10-CM | POA: Insufficient documentation

## 2018-05-05 DIAGNOSIS — M25551 Pain in right hip: Secondary | ICD-10-CM

## 2018-05-05 NOTE — Therapy (Signed)
[facility] [address] [place], [place], [address] Phone: [phone]   Fax:  [phone]  Physical Therapy Evaluation  Patient Details  Name: Tricia Baker MRN: [id number] Date of Birth: 01/24/1951 Referring Provider: Rogers Seeds, MD   Encounter Date: 05/05/2018  PT End of Session - 05/05/18 1222    Visit Number  1    Number of Visits  13    Date for PT Re-Evaluation  06/09/18    PT Start Time  1034    PT Stop Time  1116    PT Time Calculation (min)  42 min    Activity Tolerance  Patient limited by pain    Behavior During Therapy  [facility] for tasks assessed/performed       Past Medical History:  Diagnosis Date  . Arthritis   . Fluid retention   . Psoriasis   . Reflux     Past Surgical History:  Procedure Laterality Date  . ABDOMINAL HYSTERECTOMY    . APPENDECTOMY    . KNEE SURGERY    . SHOULDER SURGERY Right   . vocal cord surgery      There were no vitals filed for this visit.   Subjective Assessment - 05/05/18 2151    Subjective  Patient arrives to physical therapy with reports of a sudden onset of right hip pain that began approximately 6 months ago. Patient reported having an MRI, ultrasound, and X-ray which found "ball disintegration" , right hip arthritis, and avascular necrosis. Patient reports pain with movement, coming to standing after sitting for a period of time, and lying down. Patient reports at worst pain as greater than 10/10 that it "takes her breath away." Patient reports pain is decreased with walking and reports 3/10 pain. Patient is scheduled to have a R total hip replacement via anterior approach on Thursday, 05/08/2018. Patient's goals are to decrease pain and improve movement.    Pertinent History  Sulfa drug allergy, no iontophoresis    Limitations  Sitting;Standing;House hold activities    Currently in Pain?  Yes    Pain Score  9     Pain Orientation  Left    Pain Descriptors / Indicators   Aching;Sore    Pain Type  Chronic pain    Pain Onset  More than a month ago    Pain Frequency  Constant    Aggravating Factors   coming to standing from sitting, moving, lying down.    Pain Relieving Factors  walking    Effect of Pain on Daily Activities  difficulty sleeping, difficulty with ADLs         [facility] PT Assessment - 05/05/18 0001      Assessment   Medical Diagnosis  Avascular necrosis of right femur head    Referring Provider  Rogers Seeds, MD    Onset Date/Surgical Date  -- ongoing    Next MD Visit  05/08/18 for R THA surgery    Prior Therapy  yes, for knee      Precautions   Precautions  Anterior Hip    Precaution Comments  Sulfa drug allergy, no iontophoresis      Balance Screen   Has the patient fallen in the past 6 months  No    Has the patient had a decrease in activity level because of a fear of falling?   No    Is the patient reluctant to leave their home because of a fear of falling?   No  Home Environment   Living Environment  Private residence      Prior Function   Level of Independence  Independent      ROM / Strength   AROM / PROM / Strength  AROM;Strength      AROM   Overall AROM   Deficits;Due to pain    Overall AROM Comments  limited to reclined supine position due to pain    AROM Assessment Site  Hip    Right/Left Hip  Right    Right Hip Flexion  116    Right Hip ABduction  -2 Pain    Right Hip ADduction  10      Strength   Overall Strength  Deficits;Due to pain    Strength Assessment Site  Hip;Knee    Right/Left Hip  Right    Right Hip Flexion  2+/5    Right Hip ABduction  2+/5 performed in supine secondary to pain    Right Hip ADduction  3-/5 performed in supine secondary to pain      Palpation   Palpation comment  Very tender to palpation along femoral triangle      Special Tests    Special Tests  Hip Special Tests    Hip Special Tests   Hip Scouring      Hip Scouring   Findings  Positive    Side  Right       Transfers   Transfers  Sit to Stand;Stand to Sit    Sit to Stand  6: Modified independent (Device/Increase time)    Stand to Sit  6: Modified independent (Device/Increase time)    Comments  increased time to perform activity      Ambulation/Gait   Gait Pattern  Antalgic;Decreased weight shift to right;Decreased stride length;Decreased stance time - right;Step-to pattern;Decreased hip/knee flexion - right;Right foot flat                Objective measurements completed on examination: See above findings.      [facility] Adult PT Treatment/Exercise - 05/05/18 0001      Modalities   Modalities  Electrical Stimulation;Moist Heat      Moist Heat Therapy   Number Minutes Moist Heat  10 Minutes    Moist Heat Location  Hip      Electrical Stimulation   Electrical Stimulation Location  R Hip    Electrical Stimulation Action  IFC    Electrical Stimulation Parameters  80-150 hz x10    Electrical Stimulation Goals  Pain             PT Education - 05/05/18 2156    Education provided  Yes    Education Details  after surgery: glute sets, quad sets    Person(s) Educated  Patient    Methods  Explanation;Demonstration;Handout    Comprehension  Verbalized understanding;Returned demonstration          PT Long Term Goals - 05/05/18 2159      PT LONG TERM GOAL #1   Title  Patient will be independent with HEP    Time  4    Period  Weeks    Status  New      PT LONG TERM GOAL #2   Title  Patient will demonstrate 4+/5 or greater hip strength in all planes to improve stability during functional activities.    Time  4    Period  Weeks    Status  New      PT LONG TERM GOAL #3  Title  Patient will demonstrate normalized gait pattern with least restictive AD.    Time  4    Period  Weeks    Status  New      PT LONG TERM GOAL #4   Title  Patient will report ability to perform ADLs with pain less than 3/10 in right hip    Time  4    Period  Weeks    Status  New      PT  LONG TERM GOAL #5   Title  Patient will demonstrate reciprocating stair negotiation in order to enter and exit out of home safely.    Time  4    Period  Weeks    Status  New             Plan - 05/05/18 2156    Clinical Impression Statement  Patient is a 67 year old female who presents to physical therapy with right hip pain, decreased right hip AROM, and decreased strength with associated pain. Patient is tender to palpation at proximal aspect of femoral triangle near right AIIS. Patient is unable to transition into certain positions without pain; objective measurements all performed in long sitting with HOB elevated. Patient ambulates with antalgic gait pattern, decreased right hip flexion, decreased right weight shift, and decreased stance time on right. Patient provided with glute sets and quad sets to begin as HEP after surgery. Patient would benefit from skilled physical therapy to address pain and to address patient's goals.     Clinical Presentation  Evolving    Clinical Decision Making  Low    Rehab Potential  Good    PT Frequency  2x / week    PT Duration  4 weeks    PT Treatment/Interventions  ADLs/Self Care Home Management;Cryotherapy;Electrical Stimulation;Moist Heat;Gait training;Stair training;Functional mobility training;Therapeutic activities;Therapeutic exercise;Balance training;Patient/family education;Neuromuscular re-education;Manual techniques;Scar mobilization;Taping;Dry needling;Passive range of motion    PT Next Visit Plan  reassess post-op, nustep, modalities for pain relief    PT Home Exercise Plan  glute sets, quad sets    Consulted and Agree with Plan of Care  Patient       Patient will benefit from skilled therapeutic intervention in order to improve the following deficits and impairments:  Abnormal gait, Pain, Decreased activity tolerance, Decreased endurance, Decreased range of motion, Decreased strength, Difficulty walking  Visit Diagnosis: Pain in right  hip  Stiffness of right hip, not elsewhere classified  Difficulty in walking, not elsewhere classified     Problem List Patient Active Problem List   Diagnosis Date Noted  . Sinusitis, acute 11/29/2017  . Annual physical exam 11/27/2016  . 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 Psoriasis vulgaris 11/27/2016  . Mixed hyperlipidemia 11/27/2016  . High risk medication use 11/27/2016  . Allergic reaction 11/27/2016   Guss Bunde, PT, DPT 05/05/2018, 10:02 PM  [facility] [address] [place], [place], [address] Phone: [phone]   Fax:  [phone]  Name: Tricia Baker MRN: [id number] Date of Birth: 02/29/1951

## 2018-05-13 ENCOUNTER — Ambulatory Visit: Payer: Federal, State, Local not specified - PPO | Admitting: Physical Therapy

## 2018-05-13 DIAGNOSIS — M25551 Pain in right hip: Secondary | ICD-10-CM

## 2018-05-13 DIAGNOSIS — R262 Difficulty in walking, not elsewhere classified: Secondary | ICD-10-CM

## 2018-05-13 DIAGNOSIS — M25651 Stiffness of right hip, not elsewhere classified: Secondary | ICD-10-CM

## 2018-05-13 NOTE — Therapy (Signed)
[facility] [address] [place], [place], [address] Phone: [phone]   Fax:  [phone]  Physical Therapy Treatment  Patient Details  Name: Tricia Baker MRN: [id number] Date of Birth: 1951/07/17 Referring Provider: Rogers Seeds, MD   Encounter Date: 05/13/2018  PT End of Session - 05/13/18 1123    Visit Number  2    Number of Visits  13    Date for PT Re-Evaluation  06/09/18    PT Start Time  1115    PT Stop Time  1206    PT Time Calculation (min)  51 min    Activity Tolerance  Patient tolerated treatment well    Behavior During Therapy  [facility] for tasks assessed/performed       Past Medical History:  Diagnosis Date  . Arthritis   . Fluid retention   . Psoriasis   . Reflux     Past Surgical History:  Procedure Laterality Date  . ABDOMINAL HYSTERECTOMY    . APPENDECTOMY    . KNEE SURGERY    . SHOULDER SURGERY Right   . vocal cord surgery      There were no vitals filed for this visit.  Subjective Assessment - 05/13/18 1122    Subjective  Patient reported surgery went well and reports 1/10 pain mainly in right groin    Pertinent History  Sulfa drug allergy, no iontophoresis    Limitations  Sitting;Standing;House hold activities    Currently in Pain?  Yes    Pain Score  1     Pain Location  Hip    Pain Orientation  Right    Pain Descriptors / Indicators  Aching    Pain Type  Chronic pain    Pain Onset  More than a month ago    Pain Frequency  Intermittent         [facility] PT Assessment - 05/13/18 0001      Assessment   Medical Diagnosis  Avascular necrosis of right femur head      Precautions   Precautions  Anterior Hip    Precaution Comments  Sulfa drug allergy, no iontophoresis      Observation/Other Assessments   Observations  obvserved with aquacell dressing, minimal ecchymosis      AROM   Overall AROM   Deficits    Right/Left Hip  Right    Right Hip Flexion  99    Right Hip ABduction  7  Pain      Strength   Overall Strength  Deficits    Strength Assessment Site  Hip grossly assessed 3/5    Right/Left Hip  Right      Palpation   Palpation comment  Very tender to palpation along femoral triangle      Transfers   Transfers  Supine to Sit;Sit to Supine    Supine to Sit  6: Modified independent (Device/Increase time)    Sit to Supine  6: Modified independent (Device/Increase time)    Comments  increased time to perform activity; uses both UE to lift R LE on/off plinth      Ambulation/Gait   Assistive device  Rolling walker    Gait Pattern  Step-through pattern;Decreased stance time - right;Decreased stride length;Decreased hip/knee flexion - right;Decreased weight shift to right                   [facility] Adult PT Treatment/Exercise - 05/13/18 0001      Exercises   Exercises  Knee/Hip  Knee/Hip Exercises: Aerobic   Nustep  Level 2 x 10 minutes      Knee/Hip Exercises: Supine   Hip Adduction Isometric  Strengthening;Right;1 set;10 reps 3 second hold    Straight Leg Raises  AROM;Right;1 set;15 reps      Modalities   Modalities  Electrical Stimulation;Cryotherapy      Cryotherapy   Number Minutes Cryotherapy  15 Minutes    Cryotherapy Location  Hip    Type of Cryotherapy  Ice pack      Electrical Stimulation   Electrical Stimulation Location  right medial thigh    Electrical Stimulation Action  Pre-mod    Electrical Stimulation Parameters  80-150 hz x15    Electrical Stimulation Goals  Pain                  PT Long Term Goals - 05/05/18 2159      PT LONG TERM GOAL #1   Title  Patient will be independent with HEP    Time  4    Period  Weeks    Status  New      PT LONG TERM GOAL #2   Title  Patient will demonstrate 4+/5 or greater hip strength in all planes to improve stability during functional activities.    Time  4    Period  Weeks    Status  New      PT LONG TERM GOAL #3   Title  Patient will demonstrate normalized  gait pattern with least restictive AD.    Time  4    Period  Weeks    Status  New      PT LONG TERM GOAL #4   Title  Patient will report ability to perform ADLs with pain less than 3/10 in right hip    Time  4    Period  Weeks    Status  New      PT LONG TERM GOAL #5   Title  Patient will demonstrate reciprocating stair negotiation in order to enter and exit out of home safely.    Time  4    Period  Weeks    Status  New            Plan - 05/13/18 1301    Clinical Impression Statement  Patient complete treatment well with minimal reports of pain. Patient was able to recall protocol as well as recall exercises provided. Patient was able to perform transfers with modified independence. Patient ambulates with rolling walker with decreased stance time, step through, and decreased hip flexion. Normal response to modalities upon removal.    Clinical Presentation  Evolving    Clinical Decision Making  Low    Rehab Potential  Good    PT Frequency  2x / week    PT Duration  4 weeks    PT Treatment/Interventions  ADLs/Self Care Home Management;Cryotherapy;Electrical Stimulation;Moist Heat;Gait training;Stair training;Functional mobility training;Therapeutic activities;Therapeutic exercise;Balance training;Patient/family education;Neuromuscular re-education;Manual techniques;Scar mobilization;Taping;Dry needling;Passive range of motion    PT Next Visit Plan  nustep, hip strengthening and ROM, modalities for pain relief    Consulted and Agree with Plan of Care  Patient       Patient will benefit from skilled therapeutic intervention in order to improve the following deficits and impairments:  Abnormal gait, Pain, Decreased activity tolerance, Decreased endurance, Decreased range of motion, Decreased strength, Difficulty walking  Visit Diagnosis: Pain in right hip  Stiffness of right hip, not elsewhere classified  Difficulty in  walking, not elsewhere classified     Problem  List Patient Active Problem List   Diagnosis Date Noted  . Sinusitis, acute 11/29/2017  . Annual physical exam 11/27/2016  . 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 Psoriasis vulgaris 11/27/2016  . Mixed hyperlipidemia 11/27/2016  . High risk medication use 11/27/2016  . Allergic reaction 11/27/2016    Guss Bunde, PT, DPT 05/13/2018, 3:25 PM  [facility] [address] [place], [place], [address] Phone: [phone]   Fax:  [phone]  Name: TARRA PENCE MRN: [id number] Date of Birth: Aug 19, 1951

## 2018-05-15 ENCOUNTER — Ambulatory Visit: Payer: Federal, State, Local not specified - PPO | Admitting: *Deleted

## 2018-05-15 DIAGNOSIS — R262 Difficulty in walking, not elsewhere classified: Secondary | ICD-10-CM

## 2018-05-15 DIAGNOSIS — M25551 Pain in right hip: Secondary | ICD-10-CM

## 2018-05-15 DIAGNOSIS — M25651 Stiffness of right hip, not elsewhere classified: Secondary | ICD-10-CM

## 2018-05-15 NOTE — Therapy (Signed)
[facility] [address] [place], [place], [address] Phone: [phone]   Fax:  [phone]  Physical Therapy Treatment  Patient Details  Name: Tricia Baker MRN: [id number] Date of Birth: September 01, 1951 Referring Provider: Rogers Seeds, MD   Encounter Date: 05/15/2018  PT End of Session - 05/15/18 1111    Visit Number  3    Number of Visits  13    Date for PT Re-Evaluation  06/09/18    PT Start Time  1115    PT Stop Time  1205    PT Time Calculation (min)  50 min       Past Medical History:  Diagnosis Date  . Arthritis   . Fluid retention   . Psoriasis   . Reflux     Past Surgical History:  Procedure Laterality Date  . ABDOMINAL HYSTERECTOMY    . APPENDECTOMY    . KNEE SURGERY    . SHOULDER SURGERY Right   . vocal cord surgery      There were no vitals filed for this visit.  Subjective Assessment - 05/15/18 1112    Pertinent History  Sulfa drug allergy, no iontophoresis    Limitations  Sitting;Standing;House hold activities    Currently in Pain?  Yes    Pain Score  6     Pain Orientation  Right    Pain Descriptors / Indicators  Aching;Sore    Pain Type  Chronic pain    Pain Onset  More than a month ago    Pain Frequency  Intermittent                       [facility] Adult PT Treatment/Exercise - 05/15/18 0001      Ambulation/Gait   Assistive device  Rolling walker    Gait Pattern  Step-through pattern;Decreased stance time - right;Decreased stride length;Decreased hip/knee flexion - right;Decreased weight shift to right cues to stand up straight      Exercises   Exercises  Knee/Hip      Knee/Hip Exercises: Aerobic   Nustep  Level 5 x 17 minutes      Knee/Hip Exercises: Supine   Hip Adduction Isometric  Strengthening;Right;10 reps;2 sets 3 second hold    Straight Leg Raises  AROM;Right;2 sets;10 reps      Modalities   Modalities  Electrical Stimulation;Cryotherapy      Cryotherapy   Number Minutes Cryotherapy  15 Minutes    Cryotherapy Location  Hip    Type of Cryotherapy  Ice pack      Electrical Stimulation   Electrical Stimulation Location   RT hip IFC x 15 mins 1-10hz    (Pended)     Electrical Stimulation Goals  Pain  (Pended)                   PT Long Term Goals - 05/05/18 2159      PT LONG TERM GOAL #1   Title  Patient will be independent with HEP    Time  4    Period  Weeks    Status  New      PT LONG TERM GOAL #2   Title  Patient will demonstrate 4+/5 or greater hip strength in all planes to improve stability during functional activities.    Time  4    Period  Weeks    Status  New      PT LONG TERM GOAL #3   Title  Patient will  demonstrate normalized gait pattern with least restictive AD.    Time  4    Period  Weeks    Status  New      PT LONG TERM GOAL #4   Title  Patient will report ability to perform ADLs with pain less than 3/10 in right hip    Time  4    Period  Weeks    Status  New      PT LONG TERM GOAL #5   Title  Patient will demonstrate reciprocating stair negotiation in order to enter and exit out of home safely.    Time  4    Period  Weeks    Status  New            Plan - 05/15/18 1136    Clinical Impression Statement  Pt arrived today doing fairly well with s/p RT THR. Pt assisted by PT for dressing change and reported that incision looked great. Pt reports that she uses RW and is up a lot during the day. She was able to increase time on Nustep as well as resistance without complaints. Cues needed during gait for erect posture. Normal response to modalities.    Rehab Potential  Good    PT Frequency  2x / week    PT Duration  4 weeks    PT Treatment/Interventions  ADLs/Self Care Home Management;Cryotherapy;Electrical Stimulation;Moist Heat;Gait training;Stair training;Functional mobility training;Therapeutic activities;Therapeutic exercise;Balance training;Patient/family education;Neuromuscular  re-education;Manual techniques;Scar mobilization;Taping;Dry needling;Passive range of motion    PT Next Visit Plan  nustep, hip strengthening and ROM, modalities for pain relief    PT Home Exercise Plan  glute sets, quad sets    Consulted and Agree with Plan of Care  Patient       Patient will benefit from skilled therapeutic intervention in order to improve the following deficits and impairments:  Abnormal gait, Pain, Decreased activity tolerance, Decreased endurance, Decreased range of motion, Decreased strength, Difficulty walking  Visit Diagnosis: Pain in right hip  Stiffness of right hip, not elsewhere classified  Difficulty in walking, not elsewhere classified     Problem List Patient Active Problem List   Diagnosis Date Noted  . Sinusitis, acute 11/29/2017  . Annual physical exam 11/27/2016  . 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 Psoriasis vulgaris 11/27/2016  . Mixed hyperlipidemia 11/27/2016  . High risk medication use 11/27/2016  . Allergic reaction 11/27/2016    Tristian Sickinger,CHRIS, PTA 05/15/2018, 1:38 PM  [facility] [address] [place], [place], [address] Phone: [phone]   Fax:  [phone]  Name: Tricia Baker MRN: [id number] Date of Birth: 1951/10/04

## 2018-05-20 ENCOUNTER — Ambulatory Visit: Payer: Federal, State, Local not specified - PPO | Attending: Student | Admitting: Physical Therapy

## 2018-05-20 DIAGNOSIS — R262 Difficulty in walking, not elsewhere classified: Secondary | ICD-10-CM | POA: Insufficient documentation

## 2018-05-20 DIAGNOSIS — M25551 Pain in right hip: Secondary | ICD-10-CM | POA: Insufficient documentation

## 2018-05-20 DIAGNOSIS — M25651 Stiffness of right hip, not elsewhere classified: Secondary | ICD-10-CM | POA: Insufficient documentation

## 2018-05-20 NOTE — Therapy (Signed)
[facility] [address] [place], [place], [address] Phone: [phone]   Fax:  [phone]  Physical Therapy Treatment  Patient Details  Name: Tricia LawmanSara S Baker MRN: [id number] Date of Birth: 1951/07/16 Referring Provider: Rogers SeedsKenneth Langfitt, MD   Encounter Date: 05/20/2018  PT End of Session - 05/20/18 1120    Visit Number  4    Number of Visits  13    Date for PT Re-Evaluation  06/09/18    PT Start Time  1116    PT Stop Time  1205    PT Time Calculation (min)  49 min    Activity Tolerance  Patient tolerated treatment well    Behavior During Therapy  [facility] for tasks assessed/performed       Past Medical History:  Diagnosis Date  . Arthritis   . Fluid retention   . Psoriasis   . Reflux     Past Surgical History:  Procedure Laterality Date  . ABDOMINAL HYSTERECTOMY    . APPENDECTOMY    . KNEE SURGERY    . SHOULDER SURGERY Right   . vocal cord surgery      There were no vitals filed for this visit.  Subjective Assessment - 05/20/18 1119    Subjective  Reports she is very sore today and didn't sleep well last night. Reports that she misstepped yesterday and stopped when it happend in her kitchen.    Pertinent History  Sulfa drug allergy, no iontophoresis    Limitations  Sitting;Standing;House hold activities    Currently in Pain?  Yes    Pain Score  6     Pain Location  Hip    Pain Orientation  Right    Pain Descriptors / Indicators  Sore    Pain Type  Chronic pain    Pain Onset  More than a month ago         [facility] PT Assessment - 05/20/18 0001      Assessment   Medical Diagnosis  Avascular necrosis of right femur head    Onset Date/Surgical Date  05/08/18    Next MD Visit  05/27/2018      Precautions   Precautions  Anterior Hip    Precaution Comments  Sulfa drug allergy, no iontophoresis                   [facility] Adult PT Treatment/Exercise - 05/20/18 0001      Knee/Hip Exercises: Aerobic    Nustep  L3 x21 min      Knee/Hip Exercises: Supine   Short Arc Quad Sets  AROM;Right;20 reps 5 sec hold    Heel Slides  AROM;Right;20 reps    Hip Adduction Isometric  Strengthening;Both;20 reps 5 sec hold    Straight Leg Raises  AROM;Right;5 reps painful and radiates into trunk      Modalities   Modalities  Electrical Stimulation;Cryotherapy      Cryotherapy   Number Minutes Cryotherapy  15 Minutes    Cryotherapy Location  Hip    Type of Cryotherapy  Ice pack      Electrical Stimulation   Electrical Stimulation Location  R hip    Electrical Stimulation Action  Pre-Mod    Electrical Stimulation Parameters  80-150 hz x15 min    Electrical Stimulation Goals  Pain                  PT Long Term Goals - 05/05/18 2159      PT LONG TERM  GOAL #1   Title  Patient will be independent with HEP    Time  4    Period  Weeks    Status  New      PT LONG TERM GOAL #2   Title  Patient will demonstrate 4+/5 or greater hip strength in all planes to improve stability during functional activities.    Time  4    Period  Weeks    Status  New      PT LONG TERM GOAL #3   Title  Patient will demonstrate normalized gait pattern with least restictive AD.    Time  4    Period  Weeks    Status  New      PT LONG TERM GOAL #4   Title  Patient will report ability to perform ADLs with pain less than 3/10 in right hip    Time  4    Period  Weeks    Status  New      PT LONG TERM GOAL #5   Title  Patient will demonstrate reciprocating stair negotiation in order to enter and exit out of home safely.    Time  4    Period  Weeks    Status  New            Plan - 05/20/18 1151    Clinical Impression Statement  Patient arrived in clinic with reports of R hip soreness and not getting adequate sleep last night. Patient slightly more limited with supine R hip exercises due to fatigue and pain especially with R SLR. Longer duration completed on Nustep without complaint of discomfort.  Patient did report aggitation with bandaging over surgical site. Patient still ambulating with FWW at this time and B TED hose donned. Normal modalities response noted following removal of the modalities.    Rehab Potential  Good    PT Frequency  2x / week    PT Duration  4 weeks    PT Treatment/Interventions  ADLs/Self Care Home Management;Cryotherapy;Electrical Stimulation;Moist Heat;Gait training;Stair training;Functional mobility training;Therapeutic activities;Therapeutic exercise;Balance training;Patient/family education;Neuromuscular re-education;Manual techniques;Scar mobilization;Taping;Dry needling;Passive range of motion    PT Next Visit Plan  nustep, hip strengthening and ROM, modalities for pain relief    PT Home Exercise Plan  glute sets, quad sets    Consulted and Agree with Plan of Care  Patient       Patient will benefit from skilled therapeutic intervention in order to improve the following deficits and impairments:  Abnormal gait, Pain, Decreased activity tolerance, Decreased endurance, Decreased range of motion, Decreased strength, Difficulty walking  Visit Diagnosis: Pain in right hip  Stiffness of right hip, not elsewhere classified  Difficulty in walking, not elsewhere classified     Problem List Patient Active Problem List   Diagnosis Date Noted  . Sinusitis, acute 11/29/2017  . Annual physical exam 11/27/2016  . 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 Psoriasis vulgaris 11/27/2016  . Mixed hyperlipidemia 11/27/2016  . High risk medication use 11/27/2016  . Allergic reaction 11/27/2016    Marvell Fuller, PTA 05/20/2018, 12:08 PM  [facility] [address] [place], [place], [address] Phone: [phone]   Fax:  [phone]  Name: Tricia Baker MRN: [id number] Date of Birth: 02/23/1951

## 2018-05-21 ENCOUNTER — Other Ambulatory Visit: Payer: Self-pay | Admitting: Physician Assistant

## 2018-05-22 ENCOUNTER — Encounter: Payer: Self-pay | Admitting: Physical Therapy

## 2018-05-22 ENCOUNTER — Ambulatory Visit: Payer: Federal, State, Local not specified - PPO | Admitting: Physical Therapy

## 2018-05-22 DIAGNOSIS — M25551 Pain in right hip: Secondary | ICD-10-CM

## 2018-05-22 DIAGNOSIS — M25651 Stiffness of right hip, not elsewhere classified: Secondary | ICD-10-CM

## 2018-05-22 DIAGNOSIS — R262 Difficulty in walking, not elsewhere classified: Secondary | ICD-10-CM

## 2018-05-22 NOTE — Therapy (Signed)
[facility] [address] [place], [place], [address] Phone: [phone]   Fax:  [phone]  Physical Therapy Treatment  Patient Details  Name: Tricia Baker MRN: [id number] Date of Birth: 1951-04-21 Referring Provider: Rogers Seeds, MD   Encounter Date: 05/22/2018  PT End of Session - 05/22/18 1121    Visit Number  5    Number of Visits  13    Date for PT Re-Evaluation  06/09/18    PT Start Time  1119    PT Stop Time  1206    PT Time Calculation (min)  47 min    Activity Tolerance  Patient tolerated treatment well    Behavior During Therapy  [facility] for tasks assessed/performed       Past Medical History:  Diagnosis Date  . Arthritis   . Fluid retention   . Psoriasis   . Reflux     Past Surgical History:  Procedure Laterality Date  . ABDOMINAL HYSTERECTOMY    . APPENDECTOMY    . KNEE SURGERY    . SHOULDER SURGERY Right   . vocal cord surgery      There were no vitals filed for this visit.  Subjective Assessment - 05/22/18 1120    Subjective  Reports that she is more sore today from healing and exercises. Reports that she has called regarding medication for nerves. Reports that she is still doing ADLs around her home.    Pertinent History  Sulfa drug allergy, no iontophoresis    Limitations  Sitting;Standing;House hold activities    Currently in Pain?  Yes    Pain Score  4     Pain Location  Hip thigh region    Pain Orientation  Right    Pain Descriptors / Indicators  Aching;Constant    Pain Type  Chronic pain    Pain Onset  More than a month ago    Pain Frequency  Constant         [facility] PT Assessment - 05/22/18 0001      Assessment   Medical Diagnosis  Avascular necrosis of right femur head    Onset Date/Surgical Date  05/08/18    Next MD Visit  05/27/2018      Precautions   Precautions  Anterior Hip    Precaution Comments  Sulfa drug allergy, no iontophoresis                   [facility]  Adult PT Treatment/Exercise - 05/22/18 0001      Ambulation/Gait   Ambulation/Gait  Yes    Ambulation/Gait Assistance  6: Modified independent (Device/Increase time)    Ambulation Distance (Feet)  95 Feet    Assistive device  Straight cane    Gait Pattern  Step-through pattern;Decreased arm swing - left;Decreased stance time - right    Gait Comments  To allow patient to progress from FWW to [facility]      Knee/Hip Exercises: Aerobic   Nustep  L5 x15 min      Knee/Hip Exercises: Standing   Hip Flexion  AROM;Right;15 reps;Knee bent    Hip Abduction  AROM;Right;15 reps;Knee straight    Hip Extension  AROM;Right;15 reps;Knee straight      Knee/Hip Exercises: Seated   Long Arc Quad  Strengthening;Right;20 reps;Weights    Long Arc Quad Weight  3 lbs.      Knee/Hip Exercises: Supine   Other Supine Knee/Hip Exercises  Supine B clam x20 repss      Modalities  Modalities  Electrical Stimulation;Cryotherapy      Cryotherapy   Number Minutes Cryotherapy  15 Minutes    Cryotherapy Location  Hip    Type of Cryotherapy  Ice pack      Electrical Stimulation   Electrical Stimulation Location  R hip    Electrical Stimulation Action  Pre-Mod    Electrical Stimulation Parameters  80-150 hz x15 min    Electrical Stimulation Goals  Pain                  PT Long Term Goals - 05/05/18 2159      PT LONG TERM GOAL #1   Title  Patient will be independent with HEP    Time  4    Period  Weeks    Status  New      PT LONG TERM GOAL #2   Title  Patient will demonstrate 4+/5 or greater hip strength in all planes to improve stability during functional activities.    Time  4    Period  Weeks    Status  New      PT LONG TERM GOAL #3   Title  Patient will demonstrate normalized gait pattern with least restictive AD.    Time  4    Period  Weeks    Status  New      PT LONG TERM GOAL #4   Title  Patient will report ability to perform ADLs with pain less than 3/10 in right hip    Time  4     Period  Weeks    Status  New      PT LONG TERM GOAL #5   Title  Patient will demonstrate reciprocating stair negotiation in order to enter and exit out of home safely.    Time  4    Period  Weeks    Status  New            Plan - 05/22/18 1158    Clinical Impression Statement  Patient tolerated today's treatment well although she continues to report soreness in R thigh region. Patient able to progress well to [facility] after very minimal education regarding proper gait sequence. Patient progressed to standing AROM R hip exercises without complaint of pain or discomfort. Patient experiencing more discomfort and soreness in quad and hip adductor region. Normal modalities response noted following removal of the modalities.    Rehab Potential  Good    PT Frequency  2x / week    PT Duration  4 weeks    PT Treatment/Interventions  ADLs/Self Care Home Management;Cryotherapy;Electrical Stimulation;Moist Heat;Gait training;Stair training;Functional mobility training;Therapeutic activities;Therapeutic exercise;Balance training;Patient/family education;Neuromuscular re-education;Manual techniques;Scar mobilization;Taping;Dry needling;Passive range of motion    PT Next Visit Plan  nustep, hip strengthening and ROM, modalities for pain relief    PT Home Exercise Plan  glute sets, quad sets    Consulted and Agree with Plan of Care  Patient       Patient will benefit from skilled therapeutic intervention in order to improve the following deficits and impairments:  Abnormal gait, Pain, Decreased activity tolerance, Decreased endurance, Decreased range of motion, Decreased strength, Difficulty walking  Visit Diagnosis: Pain in right hip  Stiffness of right hip, not elsewhere classified  Difficulty in walking, not elsewhere classified     Problem List Patient Active Problem List   Diagnosis Date Noted  . Sinusitis, acute 11/29/2017  . Annual physical exam 11/27/2016  . Latent tuberculosis by  skin test 11/27/2016  .  Psoriasis vulgaris 11/27/2016  . Mixed hyperlipidemia 11/27/2016  . High risk medication use 11/27/2016  . Allergic reaction 11/27/2016    Marvell FullerKelsey P Nelli Swalley, PTA 05/22/2018, 12:15 PM  [facility] [address] [place], [place], [address] Phone: [phone]   Fax:  [phone]  Name: Tricia Baker MRN: [id number] Date of Birth: 1951/04/21

## 2018-05-26 ENCOUNTER — Encounter: Payer: Self-pay | Admitting: Physical Therapy

## 2018-05-26 ENCOUNTER — Ambulatory Visit: Payer: Federal, State, Local not specified - PPO | Admitting: Physical Therapy

## 2018-05-26 DIAGNOSIS — R262 Difficulty in walking, not elsewhere classified: Secondary | ICD-10-CM

## 2018-05-26 DIAGNOSIS — M25551 Pain in right hip: Secondary | ICD-10-CM

## 2018-05-26 DIAGNOSIS — M25651 Stiffness of right hip, not elsewhere classified: Secondary | ICD-10-CM

## 2018-05-26 NOTE — Therapy (Signed)
[facility] [address] [place], [place], [address] Phone: [phone]   Fax:  [phone]  Physical Therapy Treatment  Patient Details  Name: Tricia Baker MRN: [id number] Date of Birth: 11-18-1951 Referring Provider: Rogers Seeds, MD   Encounter Date: 05/26/2018  PT End of Session - 05/26/18 1125    Visit Number  6    Number of Visits  13    Date for PT Re-Evaluation  06/09/18    PT Start Time  1119    PT Stop Time  1207    PT Time Calculation (min)  48 min    Activity Tolerance  Patient tolerated treatment well    Behavior During Therapy  [facility] for tasks assessed/performed       Past Medical History:  Diagnosis Date  . Arthritis   . Fluid retention   . Psoriasis   . Reflux     Past Surgical History:  Procedure Laterality Date  . ABDOMINAL HYSTERECTOMY    . APPENDECTOMY    . KNEE SURGERY    . SHOULDER SURGERY Right   . vocal cord surgery      There were no vitals filed for this visit.  Subjective Assessment - 05/26/18 1120    Subjective  Reports that she has some swelling but was sitting a lot and walking on concrete flooring. Reports that hopefully she gets the stitches taken out tomorrow.    Pertinent History  Sulfa drug allergy, no iontophoresis    Limitations  Sitting;Standing;House hold activities    Currently in Pain?  Other (Comment) No pain assessment provided by patient         [facility] PT Assessment - 05/26/18 0001      Assessment   Medical Diagnosis  Avascular necrosis of right femur head    Onset Date/Surgical Date  05/08/18    Next MD Visit  05/27/2018      Precautions   Precautions  Anterior Hip    Precaution Comments  Sulfa drug allergy, no iontophoresis                   [facility] Adult PT Treatment/Exercise - 05/26/18 0001      Knee/Hip Exercises: Aerobic   Nustep  L5 x17 min      Knee/Hip Exercises: Standing   Hip Flexion  AROM;Right;20 reps;Knee bent    Hip Abduction   AROM;Right;20 reps;Knee straight    Hip Extension  AROM;Right;20 reps;Knee straight    Forward Step Up  Right;3 sets;10 reps;Hand Hold: 2;Step Height: 6"      Knee/Hip Exercises: Seated   Long Arc Quad  Strengthening;Right;20 reps;Weights    Long Arc Quad Weight  4 lbs.    Sit to Sand  10 reps;without UE support      Knee/Hip Exercises: Supine   Straight Leg Raises  AROM;Right;5 reps stopped secondary to pain      Modalities   Modalities  Electrical Stimulation;Cryotherapy      Cryotherapy   Number Minutes Cryotherapy  15 Minutes    Cryotherapy Location  Hip    Type of Cryotherapy  Ice pack      Electrical Stimulation   Electrical Stimulation Location  R hip    Electrical Stimulation Action  Pre-mod    Electrical Stimulation Parameters  80-150 hz x15 min    Electrical Stimulation Goals  Pain                  PT Long Term Goals - 05/05/18  2159      PT LONG TERM GOAL #1   Title  Patient will be independent with HEP    Time  4    Period  Weeks    Status  New      PT LONG TERM GOAL #2   Title  Patient will demonstrate 4+/5 or greater hip strength in all planes to improve stability during functional activities.    Time  4    Period  Weeks    Status  New      PT LONG TERM GOAL #3   Title  Patient will demonstrate normalized gait pattern with least restictive AD.    Time  4    Period  Weeks    Status  New      PT LONG TERM GOAL #4   Title  Patient will report ability to perform ADLs with pain less than 3/10 in right hip    Time  4    Period  Weeks    Status  New      PT LONG TERM GOAL #5   Title  Patient will demonstrate reciprocating stair negotiation in order to enter and exit out of home safely.    Time  4    Period  Weeks    Status  New            Plan - 05/26/18 1208    Clinical Impression Statement  Patient tolerated today's treatment well with only complaints of increased edema in R hip following busy weekend and decreased time with LEs  elevated. Patient guided through more standing and weightbearing exercises without complaints. Patient ambulated into clinic with SPC in good gait sequencing. Patient able to complete limited reps of SLR secondary to lateral R hip discomfort. Patient then reported soreness of the R quad. Normal modalities response noted following removal of the modalities.    Rehab Potential  Good    PT Frequency  2x / week    PT Duration  4 weeks    PT Treatment/Interventions  ADLs/Self Care Home Management;Cryotherapy;Electrical Stimulation;Moist Heat;Gait training;Stair training;Functional mobility training;Therapeutic activities;Therapeutic exercise;Balance training;Patient/family education;Neuromuscular re-education;Manual techniques;Scar mobilization;Taping;Dry needling;Passive range of motion    PT Next Visit Plan  nustep, hip strengthening and ROM, modalities for pain relief    PT Home Exercise Plan  glute sets, quad sets    Consulted and Agree with Plan of Care  Patient       Patient will benefit from skilled therapeutic intervention in order to improve the following deficits and impairments:  Abnormal gait, Pain, Decreased activity tolerance, Decreased endurance, Decreased range of motion, Decreased strength, Difficulty walking  Visit Diagnosis: Pain in right hip  Stiffness of right hip, not elsewhere classified  Difficulty in walking, not elsewhere classified     Problem List Patient Active Problem List   Diagnosis Date Noted  . Sinusitis, acute 11/29/2017  . Annual physical exam 11/27/2016  . 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 Psoriasis vulgaris 11/27/2016  . Mixed hyperlipidemia 11/27/2016  . High risk medication use 11/27/2016  . Allergic reaction 11/27/2016    Marvell FullerKelsey P Kennon, PTA 05/26/2018, 12:11 PM  [facility] [address] [place], [place], [address] Phone: [phone]   Fax:  [phone]  Name: Tricia Baker MRN: [id number] Date of Birth: Nov 09, 1951

## 2018-05-29 ENCOUNTER — Ambulatory Visit: Payer: Federal, State, Local not specified - PPO | Admitting: *Deleted

## 2018-05-29 DIAGNOSIS — M25551 Pain in right hip: Secondary | ICD-10-CM | POA: Diagnosis not present

## 2018-05-29 DIAGNOSIS — M25651 Stiffness of right hip, not elsewhere classified: Secondary | ICD-10-CM

## 2018-05-29 DIAGNOSIS — R262 Difficulty in walking, not elsewhere classified: Secondary | ICD-10-CM

## 2018-05-29 NOTE — Therapy (Signed)
[facility] [place], [place], [address] Phone: [phone]   Fax:  [phone]  Physical Therapy Treatment  Patient Details  Name: Tricia Baker MRN: [id number] Date of Birth: Dec 22, 1950 Referring Provider: Carles Collet, MD   Encounter Date: 05/29/2018  PT End of Session - 05/29/18 1132    Visit Number  7    Number of Visits  13    Date for PT Re-Evaluation  06/09/18    PT Start Time  1115    PT Stop Time  8309    PT Time Calculation (min)  50 min       Past Medical History:  Diagnosis Date  . Arthritis   . Fluid retention   . Psoriasis   . Reflux     Past Surgical History:  Procedure Laterality Date  . ABDOMINAL HYSTERECTOMY    . APPENDECTOMY    . KNEE SURGERY    . SHOULDER SURGERY Right   . vocal cord surgery      There were no vitals filed for this visit.                    [name] Adult PT Treatment/Exercise - 05/29/18 0001      Ambulation/Gait   Ambulation/Gait  Yes    Ambulation Distance (Feet)  95 Feet    Gait Pattern  Step-through pattern    Gait Comments  no AD now      Knee/Hip Exercises: Aerobic   Nustep  L5 x15 min      Knee/Hip Exercises: Machines for Strengthening   Cybex Knee Extension  10#s 3x10      Knee/Hip Exercises: Standing   Hip Flexion  AROM;Right;20 reps;Knee bent    Hip Abduction  AROM;Right;20 reps;Knee straight    Hip Extension  AROM;Right;20 reps;Knee straight    Forward Step Up  Right;3 sets;10 reps;Hand Hold: 2;Step Height: 6"    Rocker Board  4 minutes      Knee/Hip Exercises: Seated   Long Arc Quad  Strengthening;Right;20 reps;Weights    Long Arc Quad Weight  4 lbs.                  PT Long Term Goals - 05/29/18 1133      PT LONG TERM GOAL #1   Title  Patient will be independent with HEP    Time  4    Period  Weeks    Status  Achieved      PT LONG TERM GOAL #2   Title  Patient will demonstrate 4+/5 or greater hip  strength in all planes to improve stability during functional activities.    Time  4    Period  Weeks    Status  Achieved      PT LONG TERM GOAL #3   Title  Patient will demonstrate normalized gait pattern with least restictive AD.    Period  Weeks    Status  Achieved      PT LONG TERM GOAL #4   Title  Patient will report ability to perform ADLs with pain less than 3/10 in right hip    Time  4    Period  Weeks    Status  Achieved      PT LONG TERM GOAL #5   Title  Patient will demonstrate reciprocating stair negotiation in order to enter and exit out of home safely.    Time  4    Period  Weeks  Status  Achieved            Plan - 05/29/18 1158    Clinical Impression Statement  Pt arrived today wanting to DC to Gym program due to doing fairly well and a high copay. She was guided through therex and act.'s  for RT hip strengthening and balance. She did great and is independent in set up of machines and other exs. She was able to meet all LTGs.    Clinical Presentation  Evolving    Clinical Decision Making  Low    Rehab Potential  Good    PT Frequency  2x / week    PT Duration  4 weeks    PT Treatment/Interventions  ADLs/Self Care Home Management;Cryotherapy;Electrical Stimulation;Moist Heat;Gait training;Stair training;Functional mobility training;Therapeutic activities;Therapeutic exercise;Balance training;Patient/family education;Neuromuscular re-education;Manual techniques;Scar mobilization;Taping;Dry needling;Passive range of motion    PT Next Visit Plan  nustep, hip strengthening and ROM, modalities for pain relief    PT Home Exercise Plan  glute sets, quad sets    Consulted and Agree with Plan of Care  Patient       Patient will benefit from skilled therapeutic intervention in order to improve the following deficits and impairments:  Abnormal gait, Pain, Decreased activity tolerance, Decreased endurance, Decreased range of motion, Decreased strength, Difficulty  walking  Visit Diagnosis: Pain in right hip  Stiffness of right hip, not elsewhere classified  Difficulty in walking, not elsewhere classified     Problem List Patient Active Problem List   Diagnosis Date Noted  . Sinusitis, acute 11/29/2017  . Annual physical exam 11/27/2016  . 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 Psoriasis vulgaris 11/27/2016  . Mixed hyperlipidemia 11/27/2016  . High risk medication use 11/27/2016  . Allergic reaction 11/27/2016     PHYSICAL THERAPY DISCHARGE SUMMARY  Visits from Start of Care: 7  Current functional level related to goals / functional outcomes: See above   Remaining deficits: See Goals   Education / Equipment: HEP  Plan: Patient agrees to discharge.  Patient goals were met. Patient is being discharged due to meeting the stated rehab goals.  ?????        Tricia Baker,CHRIS, PTA 05/29/2018, 12:13 PM   [facility] [place], [place], [address] Phone: [phone]   Fax:  [phone]  Name: Tricia Baker MRN: [id number] Date of Birth: Feb 15, 1951

## 2018-06-10 ENCOUNTER — Other Ambulatory Visit: Payer: Self-pay | Admitting: Physician Assistant

## 2018-07-16 ENCOUNTER — Other Ambulatory Visit: Payer: Self-pay | Admitting: Family Medicine

## 2018-07-17 NOTE — Telephone Encounter (Signed)
Last seen 01/31/18   

## 2018-09-29 ENCOUNTER — Other Ambulatory Visit: Payer: Self-pay | Admitting: Physician Assistant

## 2018-11-27 ENCOUNTER — Other Ambulatory Visit: Payer: Self-pay | Admitting: Physician Assistant

## 2019-01-27 ENCOUNTER — Other Ambulatory Visit: Payer: Self-pay | Admitting: Physician Assistant

## 2020-03-20 IMAGING — MR MR FEMUR*R* WO/W CM
3 of 9 series · 9 of 40 positions shown · IV contrast (multihance)
Comparison: Sonogram of 03/06/2018.

CLINICAL DATA: Right groin palpable region for 2 months.

EXAM:
MRI OF THE RIGHT FEMUR WITHOUT AND WITH CONTRAST
TECHNIQUE: Multiplanar, multisequence MR imaging of the right hip and proximal
femur. Was performed both before and after administration of
intravenous contrast.
CONTRAST:  18mL MULTIHANCE GADOBENATE DIMEGLUMINE 529 MG/ML IV SOLN
Creatinine was obtained on site at [HOSPITAL] at [HOSPITAL].
Results: Creatinine 0.9 mg/dL.

[Series 7: T2 fat-sat · axial · 6.0mm · 0.45mm/px · z∈[+28,+247]mm · 3 of 35 slices shown (1 of 3)]
[im 7/35]
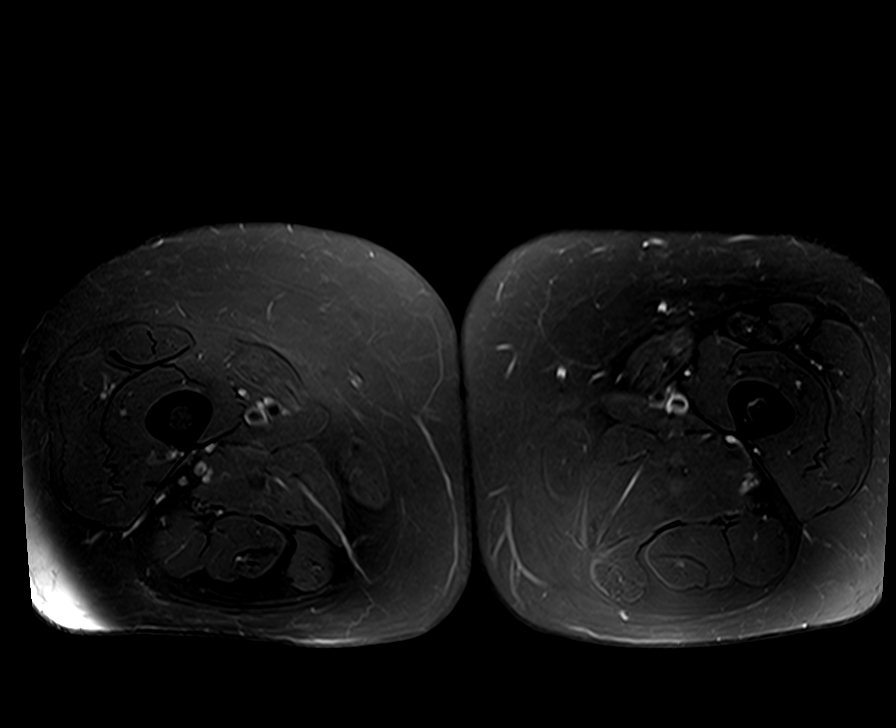
[im 21/35]
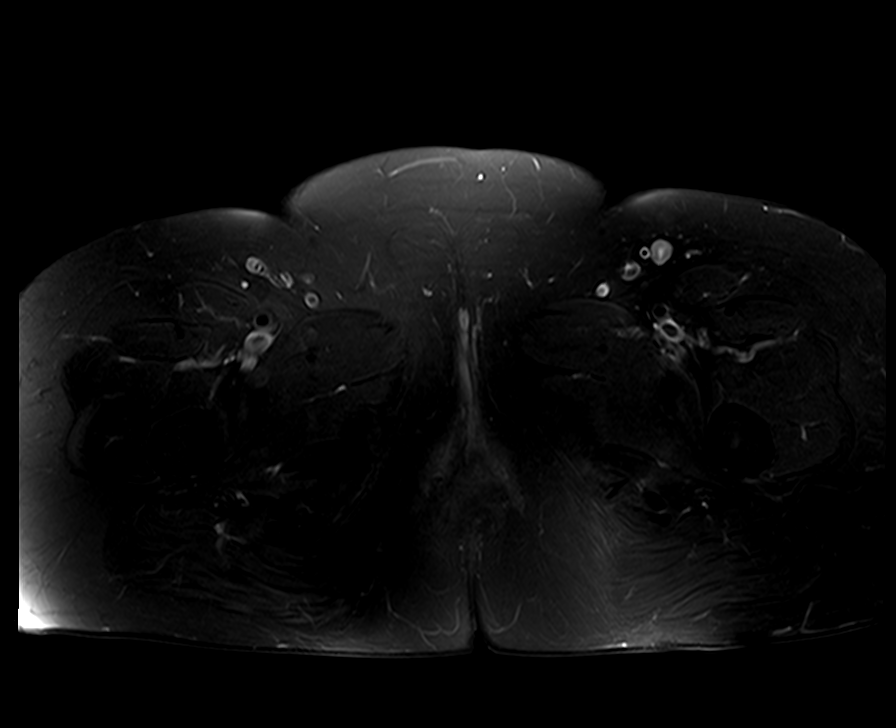
[im 35/35]
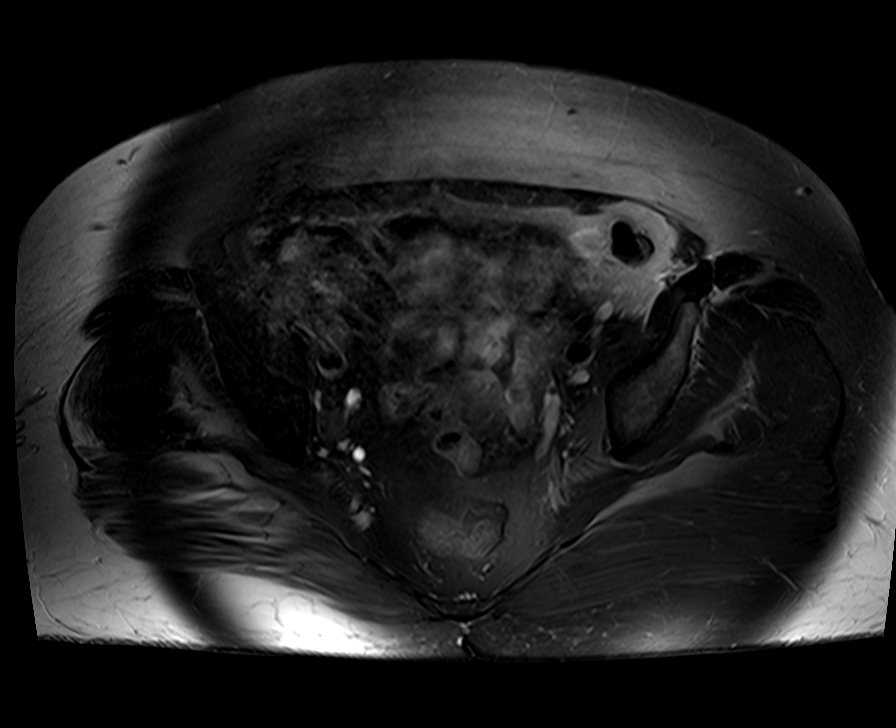

[Series 9: T2 fat-sat · sagittal · 4.0mm · 0.45mm/px · 3 of 27 slices shown (2 of 3)]
[im 1/27]
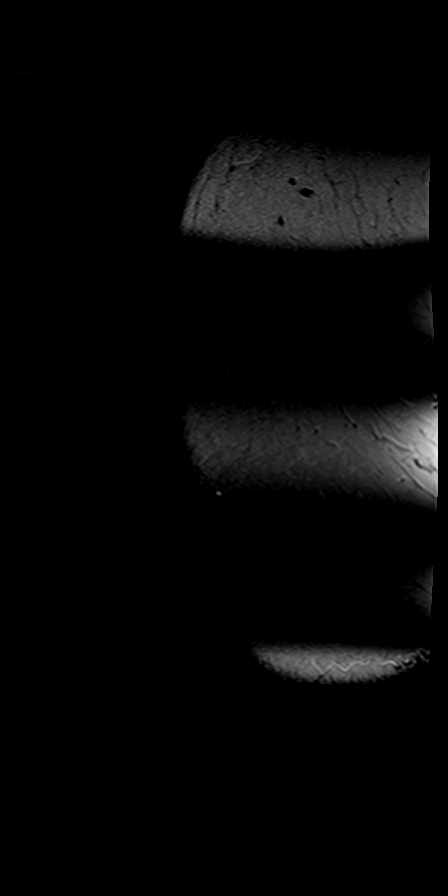
[im 18/27]
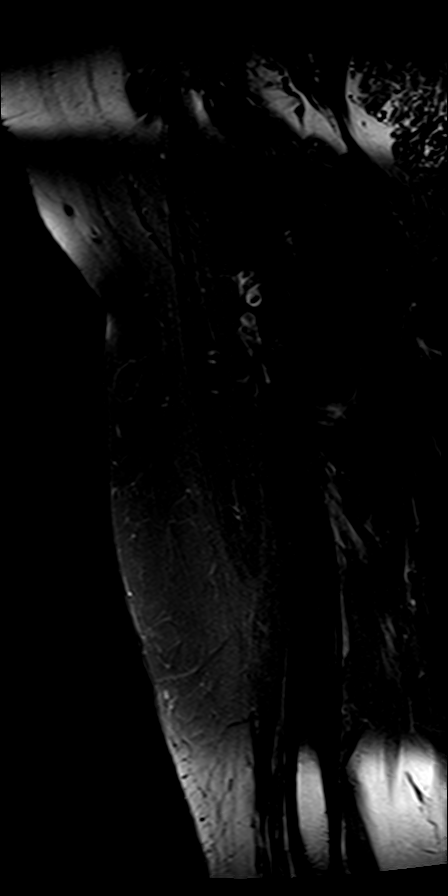
[im 27/27]
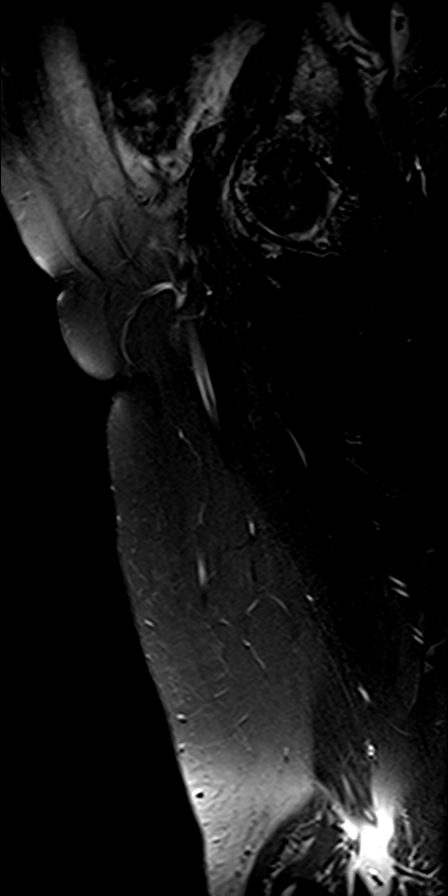

[Series 10: T2 fat-sat · coronal · 4.0mm · 0.50mm/px · 3 of 23 slices shown (3 of 3)]
[im 1/23]
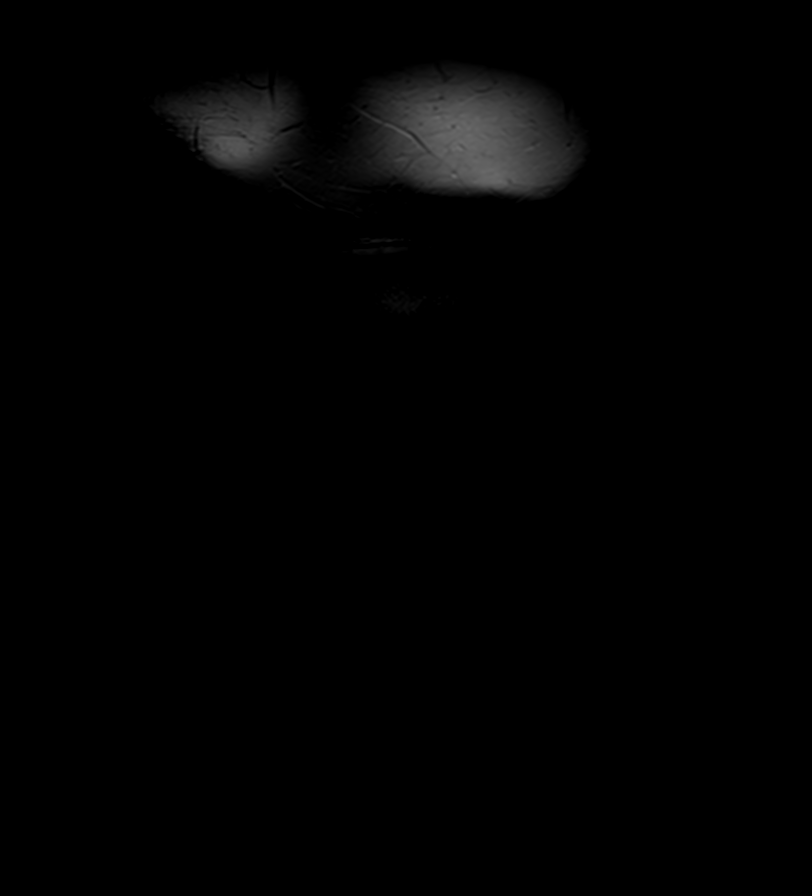
[im 12/23]
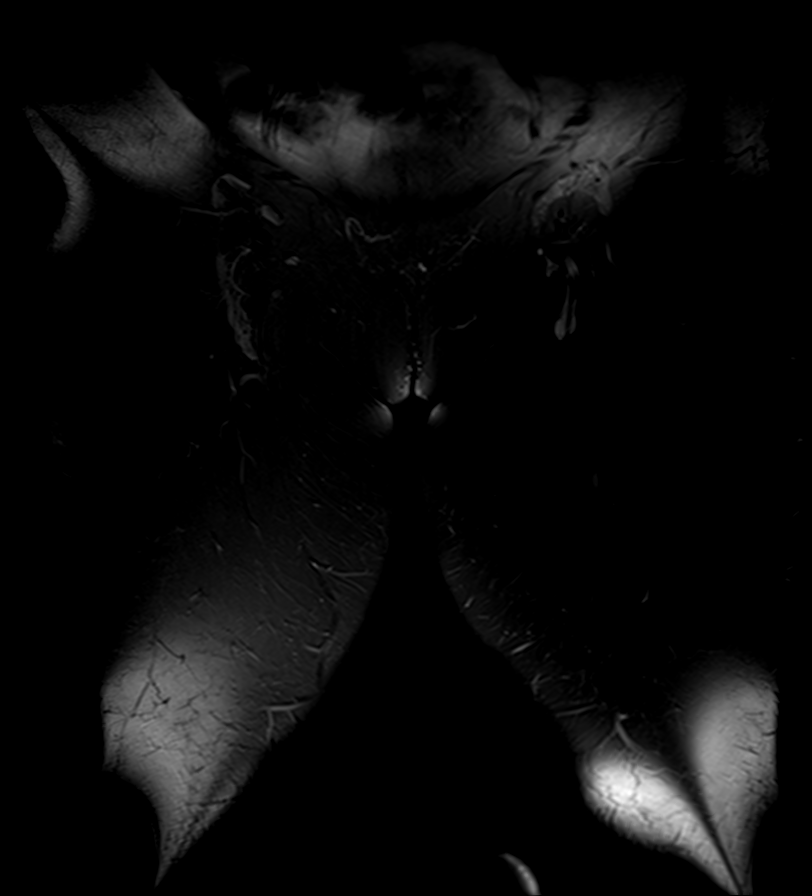
[im 23/23]
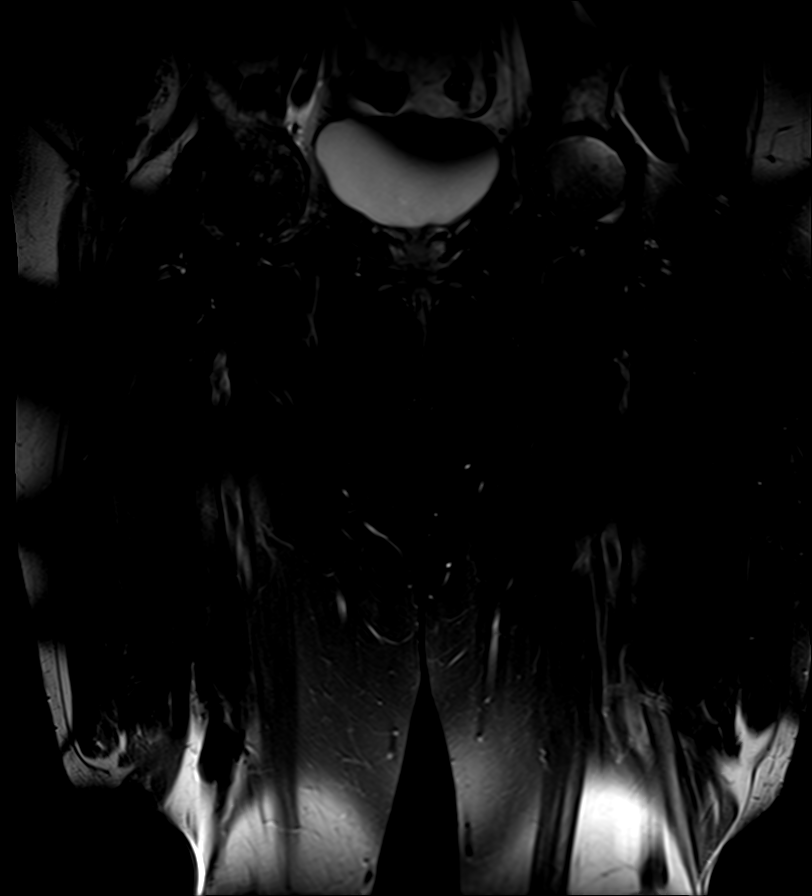

[9 of 40 positions shown; findings below may reference images not displayed]

FINDINGS: A vitamin-E capsule (shown on image [DATE]) was utilized to demarcate
the region of concern.

Bones/Joint/Cartilage

Asymmetric degenerative hip arthropathy, severe on the right side,
with multifocal degenerative subcortical lesions in the right
femoral head and acetabulum especially anteriorly. There is
low-level marrow edema in the anterior wall of the acetabulum,
likely secondary to inflammation. No hip joint effusion. There seems
to be some mild contour flattening of the femoral head in the area
of most severe subcortical cyst confluence, for example on images 25
through 26 of series 9.

Ligaments

Iliofemoral ligaments appear normal.

Muscles and Tendons

Hamstring tendons appear intact and there no appreciable findings of
athletic pubalgia. Hip adductor musculature unremarkable.

Soft tissues

The marker is superficial to the right sartorius and rectus femoris
muscles about the vertical level of the lesser trochanter. The
underlying muscles appear normal and there is no abnormal underlying
lymph node. Relatively unremarkable subcutaneous adipose tissues are
present in this vicinity. Based on the appearance on ultrasound, the
reported mass is thought to be superficial, and given the lack of
differentiation from surrounding tissues and lack of enhancement, I
am suspicious that there is probably an underlying lipoma. Certainly
no other worrisome type of lesion is observed superficially in the
area of concern.
IMPRESSION: 1. No worrisome lesion is identified underlying the area of concern.
Most likely the palpable lesion represents a lipoma. The only
unusual feature is the patient's pain, as usually lipomas are not
painful. The patient does have severe underlying right hip
arthropathy, which may be contributory to pain upon palpation.

## 2020-06-23 ENCOUNTER — Ambulatory Visit: Payer: Federal, State, Local not specified - PPO | Attending: Student | Admitting: Physical Therapy

## 2020-06-23 ENCOUNTER — Other Ambulatory Visit: Payer: Self-pay

## 2020-06-23 DIAGNOSIS — M25562 Pain in left knee: Secondary | ICD-10-CM | POA: Diagnosis present

## 2020-06-23 NOTE — Therapy (Signed)
[facility] [address] [place], [place], [address] Phone: [phone]   Fax:  [phone]  Physical Therapy Evaluation  Patient Details  Name: Tricia Baker MRN: [id number] Date of Birth: 1951/06/27 Referring Provider (PT): Levonne Lapping MD   Encounter Date: 06/23/2020   PT End of Session - 06/23/20 1434    Visit Number 1    Number of Visits 12    Date for PT Re-Evaluation 08/04/20    PT Start Time 0945    PT Stop Time 1027    PT Time Calculation (min) 42 min    Activity Tolerance Patient tolerated treatment well    Behavior During Therapy [facility] for tasks assessed/performed           Past Medical History:  Diagnosis Date  . Arthritis   . Fluid retention   . Psoriasis   . Reflux     Past Surgical History:  Procedure Laterality Date  . ABDOMINAL HYSTERECTOMY    . APPENDECTOMY    . KNEE SURGERY    . SHOULDER SURGERY Right   . vocal cord surgery      There were no vitals filed for this visit.    Subjective Assessment - 06/23/20 1309    Subjective COVID-19 screen performed prior to patient entering clinic.  The patient presenst to the clinic today with c/o left knee pain and sweeling that started  about three months ago.  She reports that recently had a blood test that my be indicative of an infection.  She will be going in this week to have fluid drwan from her left knee from testing.  Her pain-level is a 6/10 but can go higher when standing and sitting for prolonged periods of time.  Her knee will alos go numb on the lateral side and a "goose egg" will pop up at times.    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    How long can you sit comfortably? Variable.    How long can you stand comfortably? Variable.    Patient Stated Goals Get rid of pain and swelling.    Currently in Pain? Yes    Pain Score 6     Pain Location Knee    Pain  Orientation Left    Pain Descriptors / Indicators Aching;Sore;Throbbing   Numb.   Pain Type Acute pain    Pain Onset More than a month ago    Pain Frequency Constant    Aggravating Factors  See above.    Pain Relieving Factors "Swelling going down."              [facility] PT Assessment - 06/23/20 0001      Assessment   Medical Diagnosis Left knee pain.    Referring Provider (PT) Levonne Lapping MD    Onset Date/Surgical Date --   ~3 months.     Precautions   Precaution Comments No ultrasound.  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      Restrictions   Weight Bearing Restrictions No      Balance Screen   Has the patient fallen in the past 6 months No    Has the patient had a decrease in activity level because of a fear of falling?  No    Is the patient reluctant to leave their home because of a fear of falling?  No      Home Tourist information centre manager  residence      Prior Function   Level of Independence Independent      ROM / Strength   AROM / PROM / Strength AROM;Strength      AROM   Overall AROM Comments Active left knee extension is full and flexion= 110 degrees.      Strength   Overall Strength Comments Normal left hip and knee strength.      Palpation   Palpation comment Tender to palption over left lateral knee, distal ITB, bursal region.  Some pain reported in popliteal fossa region.      Special Tests   Other special tests Circumferential measurement taken at apex of patella left 1.5 cms > right.      Ambulation/Gait   Gait Comments Mild agit antalgia noted today.                      Objective measurements completed on examination: See above findings.       [facility] Adult PT Treatment/Exercise - 06/23/20 0001      Modalities   Modalities Electrical Stimulation;Vasopneumatic      Electrical Stimulation   Electrical Stimulation Location Left distal ITB region.    Electrical Stimulation Action Pre-mod.    Electrical  Stimulation Parameters 80-150 Hz x 15 minutes.    Electrical Stimulation Goals Edema;Pain      Vasopneumatic   Number Minutes Vasopneumatic  15 minutes    Vasopnuematic Location  --   Right knee.   Vasopneumatic Pressure Low                       PT Long Term Goals - 06/23/20 1442      PT LONG TERM GOAL #1   Title Patient will be independent with HEP    Time 6    Period Weeks    Status New      PT LONG TERM GOAL #2   Title Perform ADL's with left knee pain not > 3/10.    Time 6    Period Weeks    Status New      PT LONG TERM GOAL #3   Title Stand 20 minutes with left knee pain not > 3/10.    Time 6    Period Weeks    Status New                  Plan - 06/23/20 1437    Clinical Impression Statement The patient presents to [facility] with c/o left lateral knee pain, sweeling and at time numbness that has been ongoing for the last three months.  She is tender to palpation at the lert distal ITB/bursal region of her knee.  Her extension is full and flexion is 110 degrees.  Her functional mobility is impaired as the longer she is up the worse her symptoms are.  She is to have fluid aspirated from her knee to r/o infection.  will benefit from skilled physical therapy intervention to address deficits and pain.    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    Examination-Activity Limitations Locomotion Level;Other    Examination-Participation Restrictions Other    Stability/Clinical Decision Making Evolving/Moderate complexity    Clinical Decision Making Low    Rehab Potential Good    PT Frequency 2x / week    PT Duration 6 weeks  PT Treatment/Interventions ADLs/Self Care Home Management;Cryotherapy;Electrical Stimulation;Moist Heat;Functional mobility training;Therapeutic activities;Therapeutic exercise;Balance  training;Patient/family education;Neuromuscular re-education;Manual techniques;Scar mobilization;Taping;Dry needling;Passive range of motion;Vasopneumatic Device    PT Next Visit Plan E'stim, vasopneumatic, STW/M/IASTM and stretching to patient's left knee.    PT Home Exercise Plan glute sets, quad sets    Consulted and Agree with Plan of Care Patient           Patient will benefit from skilled therapeutic intervention in order to improve the following deficits and impairments:  Pain, Decreased activity tolerance, Increased edema, Decreased range of motion  Visit Diagnosis: Acute pain of left knee - Plan: PT plan of care cert/re-cert     Problem List Patient Active Problem List   Diagnosis Date Noted  . Sinusitis, acute 11/29/2017  . Annual physical exam 11/27/2016  . 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 Psoriasis vulgaris 11/27/2016  . Mixed hyperlipidemia 11/27/2016  . High risk medication use 11/27/2016  . Allergic reaction 11/27/2016    Candra Wegner, [place] MPT 06/23/2020, 2:45 PM  [facility] [address] [place], [place], [address] Phone: [phone]   Fax:  [phone]  Name: LOLITHA TORTORA MRN: [id number] Date of Birth: Feb 18, 1951

## 2020-06-28 ENCOUNTER — Other Ambulatory Visit: Payer: Self-pay

## 2020-06-28 ENCOUNTER — Ambulatory Visit: Payer: Federal, State, Local not specified - PPO | Admitting: Physical Therapy

## 2020-06-28 DIAGNOSIS — M25562 Pain in left knee: Secondary | ICD-10-CM

## 2020-06-28 NOTE — Therapy (Signed)
[facility] [address] [place], [place], [address] Phone: [phone]   Fax:  [phone]  Physical Therapy Treatment  Patient Details  Name: Tricia Baker MRN: [id number] Date of Birth: Feb 10, 1951 Referring Provider (PT): Levonne Lapping MD   Encounter Date: 06/28/2020   PT End of Session - 06/28/20 1329    Visit Number 2    Number of Visits 12    Date for PT Re-Evaluation 08/04/20    PT Start Time 0101    PT Stop Time 0138    PT Time Calculation (min) 37 min    Activity Tolerance Patient tolerated treatment well    Behavior During Therapy [facility] for tasks assessed/performed           Past Medical History:  Diagnosis Date  . Arthritis   . Fluid retention   . Psoriasis   . Reflux     Past Surgical History:  Procedure Laterality Date  . ABDOMINAL HYSTERECTOMY    . APPENDECTOMY    . KNEE SURGERY    . SHOULDER SURGERY Right   . vocal cord surgery      There were no vitals filed for this visit.   Subjective Assessment - 06/28/20 1327    Subjective COVID-19 screen performed prior to patient entering clinic.  Fluid aspirated from right knee revealed no infection.    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    Limitations Sitting;Standing;House hold activities    How long can you sit comfortably? Variable.    Currently in Pain? Yes    Pain Location Knee    Pain Orientation Left    Pain Descriptors / Indicators Aching;Sore;Throbbing    Pain Type Acute pain                             [facility] Adult PT Treatment/Exercise - 06/28/20 0001      Modalities   Modalities Electrical Stimulation;Vasopneumatic      Electrical Stimulation   Electrical Stimulation Location Left distal ITB    Electrical Stimulation Action Pre-mod.    Electrical Stimulation Parameters 80-150 Hz x 20 minutes.    Electrical Stimulation Goals Edema;Pain       Vasopneumatic   Number Minutes Vasopneumatic  20 minutes    Vasopnuematic Location  --   Left knee.   Vasopneumatic Pressure Low      Manual Therapy   Manual Therapy Soft tissue mobilization    Soft tissue mobilization Gentle STW/M x 9 minutes to patient's left distal ITB.                  PT Education - 06/28/20 1331    Education Details Short duration ice massage    Person(s) Educated Patient    Methods Explanation    Comprehension Verbalized understanding               PT Long Term Goals - 06/23/20 1442      PT LONG TERM GOAL #1   Title Patient will be independent with HEP    Time 6    Period Weeks    Status New      PT LONG TERM GOAL #2   Title Perform ADL's with left knee pain not > 3/10.    Time 6    Period Weeks    Status New      PT LONG TERM GOAL #  3   Title Stand 20 minutes with left knee pain not > 3/10.    Time 6    Period Weeks    Status New                 Plan - 06/28/20 1332    Clinical Impression Statement Per patient no infection in left knee.  She remains very tender to palpation over left distal ITB region of her knee.  Instructed in strecth performed in supine which patient did with excellent technqiue.    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    Examination-Activity Limitations Locomotion Level;Other    Examination-Participation Restrictions Other    Stability/Clinical Decision Making Evolving/Moderate complexity    Rehab Potential Good    PT Frequency 2x / week    PT Duration 6 weeks    PT Treatment/Interventions ADLs/Self Care Home Management;Cryotherapy;Electrical Stimulation;Moist Heat;Functional mobility training;Therapeutic activities;Therapeutic exercise;Balance training;Patient/family education;Neuromuscular re-education;Manual techniques;Scar  mobilization;Taping;Dry needling;Passive range of motion;Vasopneumatic Device    PT Next Visit Plan E'stim, vasopneumatic, STW/M/IASTM and stretching to patient's left knee.    PT Home Exercise Plan glute sets, quad sets    Consulted and Agree with Plan of Care Patient           Patient will benefit from skilled therapeutic intervention in order to improve the following deficits and impairments:  Pain, Decreased activity tolerance, Increased edema, Decreased range of motion  Visit Diagnosis: Acute pain of left knee     Problem List Patient Active Problem List   Diagnosis Date Noted  . Sinusitis, acute 11/29/2017  . Annual physical exam 11/27/2016  . 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 Psoriasis vulgaris 11/27/2016  . Mixed hyperlipidemia 11/27/2016  . High risk medication use 11/27/2016  . Allergic reaction 11/27/2016    Zen Cedillos, [place] MPT 06/28/2020, 1:38 PM  [facility] [address] [place], [place], [address] Phone: [phone]   Fax:  [phone]  Name: Tricia Baker MRN: [id number] Date of Birth: 06-07-1951

## 2020-06-30 ENCOUNTER — Other Ambulatory Visit: Payer: Self-pay

## 2020-06-30 ENCOUNTER — Ambulatory Visit: Payer: Federal, State, Local not specified - PPO | Admitting: Physical Therapy

## 2020-06-30 ENCOUNTER — Encounter: Payer: Self-pay | Admitting: Physical Therapy

## 2020-06-30 DIAGNOSIS — M25562 Pain in left knee: Secondary | ICD-10-CM

## 2020-06-30 NOTE — Therapy (Signed)
[facility] [address] [place], [place], [address] Phone: [phone]   Fax:  [phone]  Physical Therapy Treatment  Patient Details  Name: Tricia Baker MRN: [id number] Date of Birth: 10/11/1951 Referring Provider (PT): Levonne Lapping MD   Encounter Date: 06/30/2020   PT End of Session - 06/30/20 1252    Visit Number 3    Number of Visits 12    Date for PT Re-Evaluation 08/04/20    PT Start Time 0945    PT Stop Time 1033    PT Time Calculation (min) 48 min    Activity Tolerance Patient tolerated treatment well    Behavior During Therapy [facility] for tasks assessed/performed           Past Medical History:  Diagnosis Date  . Arthritis   . Fluid retention   . Psoriasis   . Reflux     Past Surgical History:  Procedure Laterality Date  . ABDOMINAL HYSTERECTOMY    . APPENDECTOMY    . KNEE SURGERY    . SHOULDER SURGERY Right   . vocal cord surgery      There were no vitals filed for this visit.   Subjective Assessment - 06/30/20 1251    Subjective COVID-19 screen performed prior to patient entering clinic. Reports left LE stays sore and stiff.    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    Limitations Sitting;Standing;House hold activities    How long can you sit comfortably? Variable.    Patient Stated Goals Get rid of pain and swelling.    Currently in Pain? Yes   did not provide number on pain scale             [facility] PT Assessment - 06/30/20 0001      Assessment   Medical Diagnosis Left knee pain.    Referring Provider (PT) Levonne Lapping MD      Precautions   Precautions Other (comment)    Precaution Comments No ultrasound.  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                         [facility] Adult PT Treatment/Exercise - 06/30/20 0001      Knee/Hip Exercises: Stretches   Passive Hamstring Stretch Left;3  reps;30 seconds    ITB Stretch Left;3 reps;30 seconds      Knee/Hip Exercises: Aerobic   Nustep Level 3 x10 mins      Modalities   Modalities Electrical Stimulation;Vasopneumatic      Electrical Stimulation   Electrical Stimulation Location Left distal ITB    Electrical Stimulation Action pre-mod    Electrical Stimulation Parameters 80-150 hz x10 mins    Electrical Stimulation Goals Edema;Pain      Vasopneumatic   Number Minutes Vasopneumatic  15 minutes    Vasopnuematic Location  Knee    Vasopneumatic Pressure Low      Manual Therapy   Manual Therapy Soft tissue mobilization    Soft tissue mobilization STW/M to distal ITB to decrease adhesions and pain                       PT Long Term Goals - 06/23/20 1442      PT LONG TERM GOAL #1   Title Patient will be independent with HEP    Time 6    Period Weeks    Status New  PT LONG TERM GOAL #2   Title Perform ADL's with left knee pain not > 3/10.    Time 6    Period Weeks    Status New      PT LONG TERM GOAL #3   Title Stand 20 minutes with left knee pain not > 3/10.    Time 6    Period Weeks    Status New                 Plan - 06/30/20 1252    Clinical Impression Statement Patient responded well to therapy session with addition of Nustep. Patient with good form and technique with supine stretching. Patient responded fairly well to [facility] to distal ITB with a decrease in adhesions upon completion. No adverse effect upon removal of modalities.    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    Examination-Activity Limitations Locomotion Level;Other    Examination-Participation Restrictions Other    Stability/Clinical Decision Making Evolving/Moderate complexity    Clinical Decision Making Low    Rehab Potential Good    PT Frequency 2x / week     PT Duration 6 weeks    PT Treatment/Interventions ADLs/Self Care Home Management;Cryotherapy;Electrical Stimulation;Moist Heat;Functional mobility training;Therapeutic activities;Therapeutic exercise;Balance training;Patient/family education;Neuromuscular re-education;Manual techniques;Scar mobilization;Taping;Dry needling;Passive range of motion;Vasopneumatic Device    PT Next Visit Plan E'stim, vasopneumatic, STW/M/IASTM and stretching to patient's left knee.    Consulted and Agree with Plan of Care Patient           Patient will benefit from skilled therapeutic intervention in order to improve the following deficits and impairments:  Pain, Decreased activity tolerance, Increased edema, Decreased range of motion  Visit Diagnosis: Acute pain of left knee     Problem List Patient Active Problem List   Diagnosis Date Noted  . Sinusitis, acute 11/29/2017  . Annual physical exam 11/27/2016  . 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 Psoriasis vulgaris 11/27/2016  . Mixed hyperlipidemia 11/27/2016  . High risk medication use 11/27/2016  . Allergic reaction 11/27/2016    Guss Bunde, PT, DPT 06/30/2020, 12:57 PM  [facility] [address] [place], [place], [address] Phone: [phone]   Fax:  [phone]  Name: Tricia Baker MRN: [id number] Date of Birth: 12-23-1950

## 2020-07-06 ENCOUNTER — Other Ambulatory Visit: Payer: Self-pay

## 2020-07-06 ENCOUNTER — Encounter: Payer: Self-pay | Admitting: Physical Therapy

## 2020-07-06 ENCOUNTER — Ambulatory Visit: Payer: Federal, State, Local not specified - PPO | Admitting: Physical Therapy

## 2020-07-06 DIAGNOSIS — M25562 Pain in left knee: Secondary | ICD-10-CM | POA: Diagnosis not present

## 2020-07-06 NOTE — Therapy (Signed)
[facility] [address] [place], [place], [address] Phone: [phone]   Fax:  [phone]  Physical Therapy Treatment  Patient Details  Name: Tricia Baker MRN: [id number] Date of Birth: 01-Mar-1951 Referring Provider (PT): Levonne Lapping MD   Encounter Date: 07/06/2020   PT End of Session - 07/06/20 1441    Visit Number 4    Number of Visits 12    Date for PT Re-Evaluation 08/04/20    PT Start Time 1438    PT Stop Time 1525    PT Time Calculation (min) 47 min    Activity Tolerance Patient tolerated treatment well    Behavior During Therapy [facility] for tasks assessed/performed           Past Medical History:  Diagnosis Date  . Arthritis   . Fluid retention   . Psoriasis   . Reflux     Past Surgical History:  Procedure Laterality Date  . ABDOMINAL HYSTERECTOMY    . APPENDECTOMY    . KNEE SURGERY    . SHOULDER SURGERY Right   . vocal cord surgery      There were no vitals filed for this visit.   Subjective Assessment - 07/06/20 1438    Subjective COVID-19 screen performed prior to patient entering clinic. Reports she had more pain when it rained Monday and still had pain on Tuesday as well. Has weedeated her whole yard this morning.    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    Limitations Sitting;Standing;House hold activities    How long can you sit comfortably? Variable.    How long can you stand comfortably? Variable.    Patient Stated Goals Get rid of pain and swelling.    Currently in Pain? Yes    Pain Score 3     Pain Location Hip    Pain Orientation Left    Pain Descriptors / Indicators Sore;Tender    Pain Type Acute pain    Pain Onset More than a month ago    Pain Frequency Constant              [facility] PT Assessment - 07/06/20 0001      Assessment   Medical Diagnosis Left knee pain.    Referring Provider (PT) Levonne Lapping MD      Precautions   Precautions Other (comment)    Precaution Comments No ultrasound.  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                         [facility] Adult PT Treatment/Exercise - 07/06/20 0001      Knee/Hip Exercises: Stretches   Passive Hamstring Stretch Left;3 reps;30 seconds    ITB Stretch Left;3 reps;30 seconds      Knee/Hip Exercises: Aerobic   Nustep Level 3 x10 mins      Modalities   Modalities Electrical Stimulation;Vasopneumatic      Electrical Stimulation   Electrical Stimulation Location Left distal ITB    Electrical Stimulation Action Pre-Mod    Electrical Stimulation Parameters 80-150 hz x10 min    Electrical Stimulation Goals Edema;Pain      Vasopneumatic   Number Minutes Vasopneumatic  10 minutes    Vasopnuematic Location  Knee    Vasopneumatic Pressure Medium    Vasopneumatic Temperature  34      Manual Therapy   Manual Therapy Myofascial release  Myofascial Release IASTW/MFR to L distal ITB to reduce tone and TPs                       PT Long Term Goals - 06/23/20 1442      PT LONG TERM GOAL #1   Title Patient will be independent with HEP    Time 6    Period Weeks    Status New      PT LONG TERM GOAL #2   Title Perform ADL's with left knee pain not > 3/10.    Time 6    Period Weeks    Status New      PT LONG TERM GOAL #3   Title Stand 20 minutes with left knee pain not > 3/10.    Time 6    Period Weeks    Status New                 Plan - 07/06/20 1539    Clinical Impression Statement Patient presented in clinic with minimal L knee pain and tenderness. Patient has greater pain with standing on concrete surface as well. Relief reported by patient with stretching. Patient reported tenderness with IASTW and redness of distal ITB. TPs also present along distal L ITB. Normal modalities response noted following removal of the modalities.    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    Examination-Activity Limitations Locomotion Level;Other    Examination-Participation Restrictions Other    Stability/Clinical Decision Making Evolving/Moderate complexity    Rehab Potential Good    PT Frequency 2x / week    PT Duration 6 weeks    PT Treatment/Interventions ADLs/Self Care Home Management;Cryotherapy;Electrical Stimulation;Moist Heat;Functional mobility training;Therapeutic activities;Therapeutic exercise;Balance training;Patient/family education;Neuromuscular re-education;Manual techniques;Scar mobilization;Taping;Dry needling;Passive range of motion;Vasopneumatic Device    PT Next Visit Plan E'stim, vasopneumatic, STW/M/IASTM and stretching to patient's left knee.    PT Home Exercise Plan glute sets, quad sets    Consulted and Agree with Plan of Care Patient           Patient will benefit from skilled therapeutic intervention in order to improve the following deficits and impairments:  Pain, Decreased activity tolerance, Increased edema, Decreased range of motion  Visit Diagnosis: Acute pain of left knee     Problem List Patient Active Problem List   Diagnosis Date Noted  . Sinusitis, acute 11/29/2017  . Annual physical exam 11/27/2016  . 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 Psoriasis vulgaris 11/27/2016  . Mixed hyperlipidemia 11/27/2016  . High risk medication use 11/27/2016  . Allergic reaction 11/27/2016    Marvell Fuller, PTA 07/06/2020, 3:46 PM  [facility] [address] [place], [place], [address] Phone: [phone]   Fax:  [phone]  Name: Tricia Baker MRN: [id number] Date of Birth: 1951-12-06

## 2020-07-08 ENCOUNTER — Ambulatory Visit: Payer: Federal, State, Local not specified - PPO | Admitting: *Deleted

## 2020-07-08 ENCOUNTER — Other Ambulatory Visit: Payer: Self-pay

## 2020-07-08 DIAGNOSIS — M25562 Pain in left knee: Secondary | ICD-10-CM | POA: Diagnosis not present

## 2020-07-08 NOTE — Therapy (Signed)
[facility] [address] [place], [place], [address] Phone: [phone]   Fax:  [phone]  Physical Therapy Treatment  Patient Details  Name: Tricia Baker MRN: [id number] Date of Birth: September 21, 1951 Referring Provider (PT): Levonne Lapping MD   Encounter Date: 07/08/2020   PT End of Session - 07/08/20 1156    Visit Number 5    Number of Visits 12    Date for PT Re-Evaluation 08/04/20    PT Start Time 0900    PT Stop Time 0953    PT Time Calculation (min) 53 min           Past Medical History:  Diagnosis Date  . Arthritis   . Fluid retention   . Psoriasis   . Reflux     Past Surgical History:  Procedure Laterality Date  . ABDOMINAL HYSTERECTOMY    . APPENDECTOMY    . KNEE SURGERY    . SHOULDER SURGERY Right   . vocal cord surgery      There were no vitals filed for this visit.   Subjective Assessment - 07/08/20 0911    Subjective COVID-19 screen performed prior to patient entering clinic.  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    Limitations Sitting;Standing;House hold activities    How long can you sit comfortably? Variable.    How long can you stand comfortably? Variable.    Patient Stated Goals Get rid of pain and swelling.    Currently in Pain? Yes    Pain Score 3     Pain Location Hip    Pain Orientation Left    Pain Descriptors / Indicators Sore                             [facility] Adult PT Treatment/Exercise - 07/08/20 0001      Knee/Hip Exercises: Aerobic   Nustep Level 3 x13 mins      Modalities   Modalities Electrical Stimulation;Vasopneumatic      Electrical Stimulation   Electrical Stimulation Location Left distal ITB    Electrical Stimulation Action premod    Electrical Stimulation Parameters 80-150hz  x 15 min    Electrical Stimulation Goals Edema;Pain      Vasopneumatic    Number Minutes Vasopneumatic  15 minutes    Vasopnuematic Location  Knee    Vasopneumatic Pressure Medium    Vasopneumatic Temperature  34      Manual Therapy   Manual Therapy Myofascial release    Myofascial Release IASTW/MFR to L distal ITB to reduce tone and TPs             Discussed HEP HS and ITB stretching             PT Long Term Goals - 06/23/20 1442      PT LONG TERM GOAL #1   Title Patient will be independent with HEP    Time 6    Period Weeks    Status New      PT LONG TERM GOAL #2   Title Perform ADL's with left knee pain not > 3/10.    Time 6    Period Weeks    Status New      PT LONG TERM GOAL #3   Title Stand 20 minutes with left knee pain not > 3/10.    Time  6    Period Weeks    Status New                 Plan - 07/08/20 6578    Clinical Impression Statement Pt arrived today doing better with decreased soreness. She still had notable tenderness distal ITB area during STW and IASTM along ITB. She did well with Rx and reports decreased pain after Rx. Pt reports performing ITB and HS stretch for HEP at this time.    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    Examination-Activity Limitations Locomotion Level;Other    Examination-Participation Restrictions Other    Stability/Clinical Decision Making Evolving/Moderate complexity    Rehab Potential Good    PT Frequency 2x / week    PT Duration 6 weeks    PT Treatment/Interventions ADLs/Self Care Home Management;Cryotherapy;Electrical Stimulation;Moist Heat;Functional mobility training;Therapeutic activities;Therapeutic exercise;Balance training;Patient/family education;Neuromuscular re-education;Manual techniques;Scar mobilization;Taping;Dry needling;Passive range of motion;Vasopneumatic Device    PT Next Visit Plan E'stim, vasopneumatic,  STW/M/IASTM and stretching to patient's left knee.    Consulted and Agree with Plan of Care Patient           Patient will benefit from skilled therapeutic intervention in order to improve the following deficits and impairments:  Pain, Decreased activity tolerance, Increased edema, Decreased range of motion  Visit Diagnosis: Acute pain of left knee     Problem List Patient Active Problem List   Diagnosis Date Noted  . Sinusitis, acute 11/29/2017  . Annual physical exam 11/27/2016  . 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 Psoriasis vulgaris 11/27/2016  . Mixed hyperlipidemia 11/27/2016  . High risk medication use 11/27/2016  . Allergic reaction 11/27/2016    Daly Whipkey,CHRIS, PTA 07/08/2020, 12:00 PM  [facility] [address] [place], [place], [address] Phone: [phone]   Fax:  [phone]  Name: Tricia Baker MRN: [id number] Date of Birth: 1951-01-04

## 2020-07-12 ENCOUNTER — Encounter: Payer: Self-pay | Admitting: Physical Therapy

## 2020-07-12 ENCOUNTER — Ambulatory Visit: Payer: Federal, State, Local not specified - PPO | Admitting: Physical Therapy

## 2020-07-12 ENCOUNTER — Other Ambulatory Visit: Payer: Self-pay

## 2020-07-12 DIAGNOSIS — M25562 Pain in left knee: Secondary | ICD-10-CM

## 2020-07-12 NOTE — Therapy (Signed)
[facility] [address] [place], [place], [address] Phone: [phone]   Fax:  [phone]  Physical Therapy Treatment  Patient Details  Name: Tricia Baker MRN: [id number] Date of Birth: 09/25/1951 Referring Provider (PT): Levonne Lapping MD   Encounter Date: 07/12/2020   PT End of Session - 07/12/20 1307    Visit Number 6    Number of Visits 12    Date for PT Re-Evaluation 08/04/20    PT Start Time 0900    PT Stop Time 0954    PT Time Calculation (min) 54 min    Activity Tolerance Patient tolerated treatment well    Behavior During Therapy [facility] for tasks assessed/performed           Past Medical History:  Diagnosis Date  . Arthritis   . Fluid retention   . Psoriasis   . Reflux     Past Surgical History:  Procedure Laterality Date  . ABDOMINAL HYSTERECTOMY    . APPENDECTOMY    . KNEE SURGERY    . SHOULDER SURGERY Right   . vocal cord surgery      There were no vitals filed for this visit.   Subjective Assessment - 07/12/20 0919    Subjective COVID-19 screen performed prior to patient entering clinic.  Reports left knee was in a lot of pain last Sunday. States her knee locked up.    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    Limitations Sitting;Standing;House hold activities    How long can you sit comfortably? Variable.    How long can you stand comfortably? Variable.    Patient Stated Goals Get rid of pain and swelling.    Currently in Pain? Yes    Pain Score 3     Pain Location Hip    Pain Orientation Left    Pain Descriptors / Indicators Sore    Pain Type Acute pain    Pain Onset More than a month ago              [facility] PT Assessment - 07/12/20 0001      Assessment   Medical Diagnosis Left knee pain.    Referring Provider (PT) Levonne Lapping MD      Precautions   Precautions Other (comment)    Precaution Comments  No ultrasound.  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                         [facility] Adult PT Treatment/Exercise - 07/12/20 0001      Exercises   Exercises Knee/Hip      Knee/Hip Exercises: Aerobic   Nustep Level 5 x11 mins      Knee/Hip Exercises: Sidelying   Hip ABduction AROM;Left;2 sets;10 reps    Clams yellow theraband 2x10      Electrical Stimulation   Electrical Stimulation Location Left distal ITB    Electrical Stimulation Action pre-mod    Electrical Stimulation Parameters 80-150 hz x15 mins    Electrical Stimulation Goals Edema;Pain      Vasopneumatic   Number Minutes Vasopneumatic  15 minutes    Vasopnuematic Location  Knee    Vasopneumatic Pressure Low    Vasopneumatic Temperature  34      Manual Therapy   Manual Therapy Myofascial release    Myofascial Release IASTW/MFR to L distal ITB to reduce tone and TPs  PT Long Term Goals - 07/12/20 5621      PT LONG TERM GOAL #1   Title Patient will be independent with HEP    Time 6    Period Weeks    Status Achieved      PT LONG TERM GOAL #2   Title Perform ADL's with left knee pain not > 3/10.    Time 6    Period Weeks    Status Achieved      PT LONG TERM GOAL #3   Title Stand 20 minutes with left knee pain not > 3/10.    Time 6    Period Weeks    Status On-going                 Plan - 07/12/20 1308    Clinical Impression Statement Patient responded well to therapy session with added left hip strengthening TEs. Patient provided with tactile cuing for proper form and technique. Patient responded well to cuing and maintained proper form for remaining reps. Ongoing tenderness to distal ITB region but responded well to IASTM and STW/M. Normal response to modalities upon removal.    Personal Factors and Comorbidities Comorbidity 1;Comorbidity 2;Comorbidity 3+    Comorbidities Sulfa drug allergy, no iontophoresis, OA, left TKA (2016), hip surgery in 2019,  bee sting allergy.    Examination-Activity Limitations Locomotion Level;Other    Examination-Participation Restrictions Other    Stability/Clinical Decision Making Evolving/Moderate complexity    Clinical Decision Making Low    Rehab Potential Good    PT Frequency 2x / week    PT Duration 6 weeks    PT Treatment/Interventions ADLs/Self Care Home Management;Cryotherapy;Electrical Stimulation;Moist Heat;Functional mobility training;Therapeutic activities;Therapeutic exercise;Balance training;Patient/family education;Neuromuscular re-education;Manual techniques;Scar mobilization;Taping;Dry needling;Passive range of motion;Vasopneumatic Device    PT Next Visit Plan E'stim, vasopneumatic, STW/M/IASTM and stretching to patient's left knee.    PT Home Exercise Plan glute sets, quad sets    Consulted and Agree with Plan of Care Patient           Patient will benefit from skilled therapeutic intervention in order to improve the following deficits and impairments:  Pain, Decreased activity tolerance, Increased edema, Decreased range of motion  Visit Diagnosis: Acute pain of left knee     Problem List Patient Active Problem List   Diagnosis Date Noted  . Sinusitis, acute 11/29/2017  . Annual physical exam 11/27/2016  . 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 Psoriasis vulgaris 11/27/2016  . Mixed hyperlipidemia 11/27/2016  . High risk medication use 11/27/2016  . Allergic reaction 11/27/2016    Guss Bunde, PT, DPT 07/12/2020, 1:11 PM  [facility] [address] [place], [place], [address] Phone: [phone]   Fax:  [phone]  Name: Tricia Baker MRN: [id number] Date of Birth: [birth year]

## 2020-07-14 ENCOUNTER — Other Ambulatory Visit: Payer: Self-pay

## 2020-07-14 ENCOUNTER — Encounter: Payer: Self-pay | Admitting: Physical Therapy

## 2020-07-14 ENCOUNTER — Ambulatory Visit: Payer: Federal, State, Local not specified - PPO | Admitting: Physical Therapy

## 2020-07-14 DIAGNOSIS — M25562 Pain in left knee: Secondary | ICD-10-CM | POA: Diagnosis not present

## 2020-07-14 NOTE — Therapy (Signed)
[facility] [address] [place], [place], [address] Phone: [phone]   Fax:  [phone]  Physical Therapy Treatment  Patient Details  Name: Tricia Baker MRN: [id number] Date of Birth: 16-Apr-1951 Referring Provider (PT): Levonne Lapping MD   Encounter Date: 07/14/2020   PT End of Session - 07/14/20 0936    Visit Number 7    Number of Visits 12    Date for PT Re-Evaluation 08/04/20    PT Start Time 0900    PT Stop Time 0950    PT Time Calculation (min) 50 min    Activity Tolerance Patient tolerated treatment well    Behavior During Therapy [facility] for tasks assessed/performed           Past Medical History:  Diagnosis Date  . Arthritis   . Fluid retention   . Psoriasis   . Reflux     Past Surgical History:  Procedure Laterality Date  . ABDOMINAL HYSTERECTOMY    . APPENDECTOMY    . KNEE SURGERY    . SHOULDER SURGERY Right   . vocal cord surgery      There were no vitals filed for this visit.   Subjective Assessment - 07/14/20 0912    Subjective COVID-19 screen performed prior to patient entering clinic. Patient reported more neck pain after "funky exercises" Noticed more swelling Tuesday night likely due to the rain but no swelling Wednesday.    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    Limitations Sitting;Standing;House hold activities    How long can you sit comfortably? Variable.    How long can you stand comfortably? Variable.    Patient Stated Goals Get rid of pain and swelling.    Currently in Pain? Yes   did not provide number on pain scale             [facility] PT Assessment - 07/14/20 0001      Assessment   Medical Diagnosis Left knee pain.    Referring Provider (PT) Levonne Lapping MD      Precautions   Precautions Other (comment)    Precaution Comments No ultrasound.  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                          [facility] Adult PT Treatment/Exercise - 07/14/20 0001      Exercises   Exercises Knee/Hip      Knee/Hip Exercises: Aerobic   Nustep Level 5 x12 mins      Modalities   Modalities Electrical Stimulation;Vasopneumatic      Electrical Stimulation   Electrical Stimulation Location Left distal ITB    Electrical Stimulation Action pre-mod    Electrical Stimulation Parameters 80-150 hz x15 mins    Electrical Stimulation Goals Edema;Pain      Vasopneumatic   Number Minutes Vasopneumatic  15 minutes    Vasopnuematic Location  Knee    Vasopneumatic Pressure Low    Vasopneumatic Temperature  34      Manual Therapy   Manual Therapy Myofascial release    Myofascial Release IASTW/MFR to L distal ITB to reduce tone and TPs                       PT Long Term Goals - 07/12/20 2725      PT LONG TERM GOAL #1   Title Patient will be independent  with HEP    Time 6    Period Weeks    Status Achieved      PT LONG TERM GOAL #2   Title Perform ADL's with left knee pain not > 3/10.    Time 6    Period Weeks    Status Achieved      PT LONG TERM GOAL #3   Title Stand 20 minutes with left knee pain not > 3/10.    Time 6    Period Weeks    Status On-going                 Plan - 07/14/20 0940    Clinical Impression Statement Patient responded fairly well to therapy session. Due to increased pain in neck, TEs sidelying exercises were held. Increased tightness in distal ITB noted with deep STW/M. No adverse effects upon removal of modalities.    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    Examination-Activity Limitations Locomotion Level;Other    Examination-Participation Restrictions Other    Stability/Clinical Decision Making Evolving/Moderate complexity    Clinical Decision Making Low     Rehab Potential Good    PT Frequency 2x / week    PT Duration 6 weeks    PT Treatment/Interventions ADLs/Self Care Home Management;Cryotherapy;Electrical Stimulation;Moist Heat;Functional mobility training;Therapeutic activities;Therapeutic exercise;Balance training;Patient/family education;Neuromuscular re-education;Manual techniques;Scar mobilization;Taping;Dry needling;Passive range of motion;Vasopneumatic Device    PT Next Visit Plan E'stim, vasopneumatic, STW/M/IASTM and stretching to patient's left knee.    PT Home Exercise Plan glute sets, quad sets    Consulted and Agree with Plan of Care Patient           Patient will benefit from skilled therapeutic intervention in order to improve the following deficits and impairments:  Pain, Decreased activity tolerance, Increased edema, Decreased range of motion  Visit Diagnosis: Acute pain of left knee     Problem List Patient Active Problem List   Diagnosis Date Noted  . Sinusitis, acute 11/29/2017  . Annual physical exam 11/27/2016  . 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 Psoriasis vulgaris 11/27/2016  . Mixed hyperlipidemia 11/27/2016  . High risk medication use 11/27/2016  . Allergic reaction 11/27/2016    Guss Bunde, PT, DPT 07/14/2020, 9:52 AM  [facility] [address] [place], [place], [address] Phone: [phone]   Fax:  [phone]  Name: Tricia Baker MRN: [id number] Date of Birth: 1951-07-21

## 2020-07-19 ENCOUNTER — Other Ambulatory Visit: Payer: Self-pay

## 2020-07-19 ENCOUNTER — Ambulatory Visit: Payer: Federal, State, Local not specified - PPO | Attending: Student | Admitting: *Deleted

## 2020-07-19 DIAGNOSIS — R600 Localized edema: Secondary | ICD-10-CM | POA: Insufficient documentation

## 2020-07-19 DIAGNOSIS — M25562 Pain in left knee: Secondary | ICD-10-CM | POA: Insufficient documentation

## 2020-07-19 NOTE — Therapy (Signed)
[facility] [address] [place], [place], [address] Phone: [phone]   Fax:  [phone]  Physical Therapy Treatment  Patient Details  Name: Tricia Baker MRN: [id number] Date of Birth: October 14, 1951 Referring Provider (PT): Levonne Lapping MD   Encounter Date: 07/19/2020   PT End of Session - 07/19/20 0858    Visit Number 8    Number of Visits 12    Date for PT Re-Evaluation 08/04/20    PT Start Time 0815    PT Stop Time 0909    PT Time Calculation (min) 54 min           Past Medical History:  Diagnosis Date  . Arthritis   . Fluid retention   . Psoriasis   . Reflux     Past Surgical History:  Procedure Laterality Date  . ABDOMINAL HYSTERECTOMY    . APPENDECTOMY    . KNEE SURGERY    . SHOULDER SURGERY Right   . vocal cord surgery      There were no vitals filed for this visit.   Subjective Assessment - 07/19/20 0857    Subjective COVID-19 screen performed prior to patient entering clinic. 4/10 pain today    Pertinent History Sulfa drug allergy, no iontophoresis, OA, left TKA (2016), hip surgery in 2019, bee sting allergy.    How long can you sit comfortably? Variable.    How long can you stand comfortably? Variable.    Patient Stated Goals Get rid of pain and swelling.    Currently in Pain? Yes    Pain Score 4     Pain Location Knee    Pain Orientation Left    Pain Descriptors / Indicators Sore    Pain Type Acute pain    Pain Onset More than a month ago                             [facility] Adult PT Treatment/Exercise - 07/19/20 0001      Exercises   Exercises Knee/Hip      Knee/Hip Exercises: Aerobic   Nustep Level 5 x12 mins      Modalities   Modalities Electrical Stimulation;Vasopneumatic      Electrical Stimulation   Electrical Stimulation Location Left distal ITB    Electrical Stimulation Action premod    Electrical Stimulation Parameters 80-150hz  x 15 mins    Electrical  Stimulation Goals Edema;Pain      Vasopneumatic   Number Minutes Vasopneumatic  15 minutes    Vasopnuematic Location  Knee    Vasopneumatic Pressure Low    Vasopneumatic Temperature  34      Manual Therapy   Manual Therapy Myofascial release    Myofascial Release IASTW/MFR to L distal ITB to reduce tone and TPs with Pt hooklying                       PT Long Term Goals - 07/12/20 9735      PT LONG TERM GOAL #1   Title Patient will be independent with HEP    Time 6    Period Weeks    Status Achieved      PT LONG TERM GOAL #2   Title Perform ADL's with left knee pain not > 3/10.    Time 6    Period Weeks    Status Achieved      PT LONG TERM GOAL #3   Title  Stand 20 minutes with left knee pain not > 3/10.    Time 6    Period Weeks    Status On-going                 Plan - 07/19/20 0940    Clinical Impression Statement Pt arrived today doing fair with LT kee/ ITB pain. Rx focused on manual STW / IASTM to LT distal ITB and surronding areas. Pt reports 5/10 pain  reduced to 2-3/10 end of session. Normal modality responses today.    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    Examination-Activity Limitations Locomotion Level;Other    Examination-Participation Restrictions Other    Stability/Clinical Decision Making Evolving/Moderate complexity    Rehab Potential Good    PT Frequency 2x / week    PT Duration 6 weeks    PT Treatment/Interventions ADLs/Self Care Home Management;Cryotherapy;Electrical Stimulation;Moist Heat;Functional mobility training;Therapeutic activities;Therapeutic exercise;Balance training;Patient/family education;Neuromuscular re-education;Manual techniques;Scar mobilization;Taping;Dry needling;Passive range of motion;Vasopneumatic Device    PT Next Visit Plan E'stim, vasopneumatic,  STW/M/IASTM and stretching to patient's left knee.    PT Home Exercise Plan glute sets, quad sets           Patient will benefit from skilled therapeutic intervention in order to improve the following deficits and impairments:  Pain, Decreased activity tolerance, Increased edema, Decreased range of motion  Visit Diagnosis: Acute pain of left knee     Problem List Patient Active Problem List   Diagnosis Date Noted  . Sinusitis, acute 11/29/2017  . Annual physical exam 11/27/2016  . 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 Psoriasis vulgaris 11/27/2016  . Mixed hyperlipidemia 11/27/2016  . High risk medication use 11/27/2016  . Allergic reaction 11/27/2016    Doyal Saric,CHRIS, PTA 07/19/2020, 9:44 AM  [facility] [address] [place], [place], [address] Phone: [phone]   Fax:  [phone]  Name: Tricia Baker MRN: [id number] Date of Birth: [birth year]

## 2020-07-21 ENCOUNTER — Encounter: Payer: Self-pay | Admitting: Physical Therapy

## 2020-07-21 ENCOUNTER — Ambulatory Visit: Payer: Federal, State, Local not specified - PPO | Admitting: Physical Therapy

## 2020-07-21 ENCOUNTER — Other Ambulatory Visit: Payer: Self-pay

## 2020-07-21 DIAGNOSIS — M25562 Pain in left knee: Secondary | ICD-10-CM | POA: Diagnosis not present

## 2020-07-21 NOTE — Therapy (Signed)
[facility] [address] [place], [place], [address] Phone: [phone]   Fax:  [phone]  Physical Therapy Treatment  Patient Details  Name: Tricia Baker MRN: [id number] Date of Birth: 06-30-1951 Referring Provider (PT): Levonne Lapping MD   Encounter Date: 07/21/2020   PT End of Session - 07/21/20 0944    Visit Number 9    Number of Visits 12    Date for PT Re-Evaluation 08/04/20    PT Start Time 0900    PT Stop Time 0953    PT Time Calculation (min) 53 min    Activity Tolerance Patient tolerated treatment well    Behavior During Therapy [facility] for tasks assessed/performed           Past Medical History:  Diagnosis Date  . Arthritis   . Fluid retention   . Psoriasis   . Reflux     Past Surgical History:  Procedure Laterality Date  . ABDOMINAL HYSTERECTOMY    . APPENDECTOMY    . KNEE SURGERY    . SHOULDER SURGERY Right   . vocal cord surgery      There were no vitals filed for this visit.   Subjective Assessment - 07/21/20 0944    Subjective COVID-19 screen performed prior to patient entering clinic. Patient reports doing fine 2/10.    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    Limitations Sitting;Standing;House hold activities    How long can you sit comfortably? Variable.    How long can you stand comfortably? Variable.    Patient Stated Goals Get rid of pain and swelling.    Currently in Pain? Yes    Pain Score 2     Pain Location Knee    Pain Orientation Left    Pain Descriptors / Indicators Sore    Pain Type Acute pain    Pain Onset More than a month ago    Pain Frequency Constant              [facility] PT Assessment - 07/21/20 0001      Assessment   Medical Diagnosis Left knee pain.      Precautions   Precautions Other (comment)    Precaution Comments No ultrasound.  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                          [facility] Adult PT Treatment/Exercise - 07/21/20 0001      Exercises   Exercises Knee/Hip      Knee/Hip Exercises: Aerobic   Nustep Level 5 x12 mins      Electrical Stimulation   Electrical Stimulation Location Left distal ITB    Electrical Stimulation Action pre-mod    Electrical Stimulation Parameters 80-150 hz x10 mins    Electrical Stimulation Goals Edema;Pain      Vasopneumatic   Number Minutes Vasopneumatic  10 minutes    Vasopnuematic Location  Knee    Vasopneumatic Pressure Low    Vasopneumatic Temperature  34 for edema      Manual Therapy   Manual Therapy Myofascial release    Myofascial Release IASTW/MFR/STW/M to L distal ITB to reduce tone and TPs with Pt hooklying                       PT Long Term Goals - 07/12/20 0923      PT LONG  TERM GOAL #1   Title Patient will be independent with HEP    Time 6    Period Weeks    Status Achieved      PT LONG TERM GOAL #2   Title Perform ADL's with left knee pain not > 3/10.    Time 6    Period Weeks    Status Achieved      PT LONG TERM GOAL #3   Title Stand 20 minutes with left knee pain not > 3/10.    Time 6    Period Weeks    Status On-going                 Plan - 07/21/20 0945    Clinical Impression Statement Patient responded well to therapy session with minimal reports of pain at end of session. STW/M and IASTM to left distal ITB performed with good response. Patient noted with decreased tightness and a decrease of pain at end of session. Normal response to modalities upon removal.    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    Examination-Activity Limitations Locomotion Level;Other    Examination-Participation Restrictions Other    Stability/Clinical Decision Making Evolving/Moderate complexity    Clinical  Decision Making Low    Rehab Potential Good    PT Frequency 2x / week    PT Duration 6 weeks    PT Treatment/Interventions ADLs/Self Care Home Management;Cryotherapy;Electrical Stimulation;Moist Heat;Functional mobility training;Therapeutic activities;Therapeutic exercise;Balance training;Patient/family education;Neuromuscular re-education;Manual techniques;Scar mobilization;Taping;Dry needling;Passive range of motion;Vasopneumatic Device    PT Next Visit Plan E'stim, vasopneumatic, STW/M/IASTM and stretching to patient's left knee.    PT Home Exercise Plan glute sets, quad sets    Consulted and Agree with Plan of Care Patient           Patient will benefit from skilled therapeutic intervention in order to improve the following deficits and impairments:  Pain, Decreased activity tolerance, Increased edema, Decreased range of motion  Visit Diagnosis: Acute pain of left knee     Problem List Patient Active Problem List   Diagnosis Date Noted  . Sinusitis, acute 11/29/2017  . Annual physical exam 11/27/2016  . 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 Psoriasis vulgaris 11/27/2016  . Mixed hyperlipidemia 11/27/2016  . High risk medication use 11/27/2016  . Allergic reaction 11/27/2016    Guss Bunde, PT, DPT 07/21/2020, 9:55 AM  [facility] [address] [place], [place], [address] Phone: [phone]   Fax:  [phone]  Name: Tricia Baker MRN: [id number] Date of Birth: Feb 17, 1951

## 2020-07-25 ENCOUNTER — Ambulatory Visit: Payer: Federal, State, Local not specified - PPO | Admitting: Physical Therapy

## 2020-07-25 ENCOUNTER — Other Ambulatory Visit: Payer: Self-pay

## 2020-07-25 ENCOUNTER — Encounter: Payer: Self-pay | Admitting: Physical Therapy

## 2020-07-25 DIAGNOSIS — M25562 Pain in left knee: Secondary | ICD-10-CM

## 2020-07-25 DIAGNOSIS — R600 Localized edema: Secondary | ICD-10-CM

## 2020-07-25 NOTE — Therapy (Signed)
[facility] [address] [place], [place], [address] Phone: [phone]   Fax:  [phone]  Physical Therapy Treatment  Patient Details  Name: Tricia Baker MRN: [id number] Date of Birth: 10/28/1951 Referring Provider (PT): Levonne Lapping MD   Encounter Date: 07/25/2020   PT End of Session - 07/25/20 1157    Visit Number 10    Number of Visits 12    Date for PT Re-Evaluation 08/04/20    PT Start Time 1118    PT Stop Time 1202    PT Time Calculation (min) 44 min    Activity Tolerance Patient tolerated treatment well    Behavior During Therapy [facility] for tasks assessed/performed           Past Medical History:  Diagnosis Date  . Arthritis   . Fluid retention   . Psoriasis   . Reflux     Past Surgical History:  Procedure Laterality Date  . ABDOMINAL HYSTERECTOMY    . APPENDECTOMY    . KNEE SURGERY    . SHOULDER SURGERY Right   . vocal cord surgery      There were no vitals filed for this visit.   Subjective Assessment - 07/25/20 1156    Subjective COVID-19 screen performed prior to patient entering clinic. Patient reporting 3/10 pain in L lateral knee.    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    Limitations Sitting;Standing;House hold activities    How long can you sit comfortably? Variable.    How long can you stand comfortably? Variable.    Patient Stated Goals Get rid of pain and swelling.    Currently in Pain? Yes    Pain Score 3     Pain Location Knee    Pain Orientation Left    Pain Descriptors / Indicators Sore    Pain Type Acute pain    Pain Onset More than a month ago                             [facility] Adult PT Treatment/Exercise - 07/25/20 0001      Exercises   Exercises Knee/Hip      Knee/Hip Exercises: Stretches   Active Hamstring Stretch Left;2 reps;30 seconds    ITB Stretch Right;Left;2 reps;20  seconds    Gastroc Stretch Both;3 reps;30 seconds      Knee/Hip Exercises: Aerobic   Nustep L5 x 10 minutes      Knee/Hip Exercises: Standing   Rocker Board 2 minutes    Other Standing Knee Exercises hip flexor stretch x 3 bilateral LE  holding 20 seconds each in lunge position with UE support      Knee/Hip Exercises: Supine   Bridges Strengthening;10 reps      Modalities   Modalities --      Programme researcher, broadcasting/film/video Location --    Statistician Action --    Statistician Parameters --    Statistician Goals --      Vasopneumatic   Number Minutes Vasopneumatic  10 minutes    Vasopnuematic Location  Knee    Vasopneumatic Pressure Low    Vasopneumatic Temperature  34 for edema      Manual Therapy   Manual Therapy Soft tissue mobilization;Myofascial release    Manual therapy comments 15 minutes    Myofascial Release IASTM/STM to distal IT band.  PT Long Term Goals - 07/25/20 1157      PT LONG TERM GOAL #1   Title Patient will be independent with HEP    Status Achieved      PT LONG TERM GOAL #2   Title Perform ADL's with left knee pain not > 3/10.    Status Achieved      PT LONG TERM GOAL #3   Title Stand 20 minutes with left knee pain not > 3/10.    Status On-going      PT LONG TERM GOAL #4   Title Patient will report ability to perform ADLs with pain less than 3/10 in right hip    Status Achieved      PT LONG TERM GOAL #5   Title Patient will demonstrate reciprocating stair negotiation in order to enter and exit out of home safely.    Status Achieved                 Plan - 07/25/20 1202    Clinical Impression Statement Pt tolerating treatment well today reporting 3/10 pain upon arrival, but pt reporting less pain since beginning therapy. Pt stated she had been on her feet all weekend. Pt still with mild swelling to L lateral knee. Manual therapy performed to pt's Left  IT band and vasopneumatic at end of session with less pain reported. Continue skilled PT to progress toward goals set.    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    Examination-Activity Limitations Locomotion Level;Other    Examination-Participation Restrictions Other    Stability/Clinical Decision Making Evolving/Moderate complexity    Rehab Potential Good    PT Frequency 2x / week    PT Duration 6 weeks    PT Treatment/Interventions ADLs/Self Care Home Management;Cryotherapy;Electrical Stimulation;Moist Heat;Functional mobility training;Therapeutic activities;Therapeutic exercise;Balance training;Patient/family education;Neuromuscular re-education;Manual techniques;Scar mobilization;Taping;Dry needling;Passive range of motion;Vasopneumatic Device    PT Next Visit Plan vasopneumatic, STW/M/IASTM and stretching to patient's left knee, hamstring stretching, IT band stretching, LE strengthening to pt's tolerance    PT Home Exercise Plan glute sets, quad sets, IT band stretch (modified)    Consulted and Agree with Plan of Care Patient           Patient will benefit from skilled therapeutic intervention in order to improve the following deficits and impairments:  Pain, Decreased activity tolerance, Increased edema, Decreased range of motion  Visit Diagnosis: Acute pain of left knee  Localized edema     Problem List Patient Active Problem List   Diagnosis Date Noted  . Sinusitis, acute 11/29/2017  . Annual physical exam 11/27/2016  . 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 Psoriasis vulgaris 11/27/2016  . Mixed hyperlipidemia 11/27/2016  . High risk medication use 11/27/2016  . Allergic reaction 11/27/2016    Lauralyn Primes, MPT 07/25/2020, 12:05 PM  [facility] [address] [place], [place], [address] Phone: [phone]   Fax:  [phone]  Name: MADA SADIK MRN: [id number] Date of Birth: 1951/08/07

## 2020-07-26 ENCOUNTER — Encounter: Payer: Federal, State, Local not specified - PPO | Admitting: Physical Therapy

## 2020-07-28 ENCOUNTER — Ambulatory Visit: Payer: Federal, State, Local not specified - PPO | Admitting: Physical Therapy

## 2020-07-28 ENCOUNTER — Encounter: Payer: Self-pay | Admitting: Physical Therapy

## 2020-07-28 ENCOUNTER — Other Ambulatory Visit: Payer: Self-pay

## 2020-07-28 DIAGNOSIS — R600 Localized edema: Secondary | ICD-10-CM

## 2020-07-28 DIAGNOSIS — M25562 Pain in left knee: Secondary | ICD-10-CM | POA: Diagnosis not present

## 2020-07-28 NOTE — Therapy (Signed)
[facility] [address] [place], [place], [address] Phone: [phone]   Fax:  [phone]  Physical Therapy Treatment  Patient Details  Name: Tricia Baker MRN: [id number] Date of Birth: [birth year] Referring Provider (PT): Levonne Lapping MD   Encounter Date: 07/28/2020   PT End of Session - 07/28/20 0920    Visit Number 11    Number of Visits 16    Date for PT Re-Evaluation 08/26/20    PT Start Time 0900    PT Stop Time 0949    PT Time Calculation (min) 49 min    Activity Tolerance Patient tolerated treatment well    Behavior During Therapy [facility] for tasks assessed/performed           Past Medical History:  Diagnosis Date  . Arthritis   . Fluid retention   . Psoriasis   . Reflux     Past Surgical History:  Procedure Laterality Date  . ABDOMINAL HYSTERECTOMY    . APPENDECTOMY    . KNEE SURGERY    . SHOULDER SURGERY Right   . vocal cord surgery      There were no vitals filed for this visit.   Subjective Assessment - 07/28/20 0919    Subjective COVID-19 screen performed prior to patient entering clinic. Patient reports being more sore due to being on concrete a lot yesterday.    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    Limitations Sitting;Standing;House hold activities    How long can you sit comfortably? Variable.    How long can you stand comfortably? Variable.    Patient Stated Goals Get rid of pain and swelling.    Currently in Pain? Yes   did not provide number on pain scale             [facility] PT Assessment - 07/28/20 0001      Assessment   Medical Diagnosis Left knee pain.    Referring Provider (PT) Levonne Lapping MD      Precautions   Precautions Other (comment)    Precaution Comments No ultrasound.  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                         [facility] Adult PT Treatment/Exercise -  07/28/20 0001      Exercises   Exercises Knee/Hip      Knee/Hip Exercises: Aerobic   Nustep L5 x 12 minutes      Knee/Hip Exercises: Standing   Rocker Board 3 minutes    Other Standing Knee Exercises lateral stepping yellow theraband x3 minutes      Vasopneumatic   Number Minutes Vasopneumatic  10 minutes    Vasopnuematic Location  Knee    Vasopneumatic Pressure Low    Vasopneumatic Temperature  34 for edema      Manual Therapy   Manual Therapy Soft tissue mobilization;Myofascial release    Myofascial Release IASTM/STM to distal IT band in hookying                       PT Long Term Goals - 07/25/20 1157      PT LONG TERM GOAL #1   Title Patient will be independent with HEP    Status Achieved      PT LONG TERM GOAL #2   Title Perform ADL's with left knee pain not > 3/10.  Status Achieved      PT LONG TERM GOAL #3   Title Stand 20 minutes with left knee pain not > 3/10.    Status On-going      PT LONG TERM GOAL #4   Title Patient will report ability to perform ADLs with pain less than 3/10 in right hip    Status Achieved      PT LONG TERM GOAL #5   Title Patient will demonstrate reciprocating stair negotiation in order to enter and exit out of home safely.    Status Achieved                 Plan - 07/28/20 1047    Clinical Impression Statement Patient responded fairly well to therapy session despite soreness in ITB region. Patient demonstrated great form with lateral stepping after explanation. Patient still with tightness to distal ITB but with overall improvement since initial visit. 4 additional visits requested to address ongoing deficits. Normal response to modalities upon removal.    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    Examination-Activity Limitations Locomotion Level;Other     Examination-Participation Restrictions Other    Stability/Clinical Decision Making Evolving/Moderate complexity    Clinical Decision Making Low    Rehab Potential Good    PT Frequency 2x / week    PT Duration 6 weeks    PT Treatment/Interventions ADLs/Self Care Home Management;Cryotherapy;Electrical Stimulation;Moist Heat;Functional mobility training;Therapeutic activities;Therapeutic exercise;Balance training;Patient/family education;Neuromuscular re-education;Manual techniques;Scar mobilization;Taping;Dry needling;Passive range of motion;Vasopneumatic Device    PT Next Visit Plan vasopneumatic, STW/M/IASTM and stretching to patient's left knee, hamstring stretching, IT band stretching, LE strengthening to pt's tolerance    Consulted and Agree with Plan of Care Patient           Patient will benefit from skilled therapeutic intervention in order to improve the following deficits and impairments:  Pain, Decreased activity tolerance, Increased edema, Decreased range of motion  Visit Diagnosis: Acute pain of left knee - Plan: PT plan of care cert/re-cert  Localized edema - Plan: PT plan of care cert/re-cert     Problem List Patient Active Problem List   Diagnosis Date Noted  . Sinusitis, acute 11/29/2017  . Annual physical exam 11/27/2016  . 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 Psoriasis vulgaris 11/27/2016  . Mixed hyperlipidemia 11/27/2016  . High risk medication use 11/27/2016  . Allergic reaction 11/27/2016    Guss Bunde, PT, DPT 07/28/2020, 10:53 AM  [facility] [address] [place], [place], [address] Phone: [phone]   Fax:  [phone]  Name: Tricia Baker MRN: [id number] Date of Birth: [birth year]

## 2020-08-01 ENCOUNTER — Encounter: Payer: Self-pay | Admitting: Physical Therapy

## 2020-08-01 ENCOUNTER — Ambulatory Visit: Payer: Federal, State, Local not specified - PPO | Admitting: Physical Therapy

## 2020-08-01 ENCOUNTER — Other Ambulatory Visit: Payer: Self-pay

## 2020-08-01 DIAGNOSIS — R600 Localized edema: Secondary | ICD-10-CM

## 2020-08-01 DIAGNOSIS — M25562 Pain in left knee: Secondary | ICD-10-CM | POA: Diagnosis not present

## 2020-08-01 NOTE — Therapy (Signed)
[facility] [address] [place], [place], [address] Phone: [phone]   Fax:  [phone]  Physical Therapy Treatment  Patient Details  Name: Tricia Baker MRN: [id number] Date of Birth: 22-Nov-1951 Referring Provider (PT): Levonne Lapping MD   Encounter Date: 08/01/2020   PT End of Session - 08/01/20 1037    Visit Number 12    Number of Visits 16    Date for PT Re-Evaluation 08/26/20    PT Start Time 1030    PT Stop Time 1116    PT Time Calculation (min) 46 min    Activity Tolerance Patient tolerated treatment well    Behavior During Therapy [facility] for tasks assessed/performed           Past Medical History:  Diagnosis Date  . Arthritis   . Fluid retention   . Psoriasis   . Reflux     Past Surgical History:  Procedure Laterality Date  . ABDOMINAL HYSTERECTOMY    . APPENDECTOMY    . KNEE SURGERY    . SHOULDER SURGERY Right   . vocal cord surgery      There were no vitals filed for this visit.   Subjective Assessment - 08/01/20 1037    Subjective COVID-19 screen performed prior to patient entering clinic. Patient reports being more sore due to the weather, 5/10.    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    Limitations Sitting;Standing;House hold activities    How long can you sit comfortably? Variable.    How long can you stand comfortably? Variable.    Patient Stated Goals Get rid of pain and swelling.    Currently in Pain? Yes    Pain Score 5     Pain Location Knee    Pain Orientation Left;Lateral    Pain Descriptors / Indicators Sore    Pain Type Acute pain    Pain Onset More than a month ago    Pain Frequency Constant              [facility] PT Assessment - 08/01/20 0001      Assessment   Medical Diagnosis Left knee pain.    Referring Provider (PT) Levonne Lapping MD      Precautions   Precautions Other (comment)     Precaution Comments No ultrasound.  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                         [facility] Adult PT Treatment/Exercise - 08/01/20 0001      Exercises   Exercises Knee/Hip      Knee/Hip Exercises: Aerobic   Nustep L5 x 12 minutes; monitored for SPM      Vasopneumatic   Number Minutes Vasopneumatic  10 minutes    Vasopnuematic Location  Knee    Vasopneumatic Pressure Low    Vasopneumatic Temperature  34 for edema      Manual Therapy   Manual Therapy Soft tissue mobilization;Myofascial release    Myofascial Release IASTM/STM to distal IT band in hookying                       PT Long Term Goals - 07/25/20 1157      PT LONG TERM GOAL #1   Title Patient will be independent with HEP    Status Achieved      PT LONG TERM GOAL #  2   Title Perform ADL's with left knee pain not > 3/10.    Status Achieved      PT LONG TERM GOAL #3   Title Stand 20 minutes with left knee pain not > 3/10.    Status On-going      PT LONG TERM GOAL #4   Title Patient will report ability to perform ADLs with pain less than 3/10 in right hip    Status Achieved      PT LONG TERM GOAL #5   Title Patient will demonstrate reciprocating stair negotiation in order to enter and exit out of home safely.    Status Achieved                 Plan - 08/01/20 1115    Clinical Impression Statement Patient responded well to therapy session with no reports of increased pain. Notable edema noted in lateral knee upon observation. Normal response to IASTM to L distal ITB. No adverse effects upon removal of vasopneumatic device.    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    Examination-Activity Limitations Locomotion Level;Other    Examination-Participation Restrictions Other    Stability/Clinical Decision Making  Evolving/Moderate complexity    Clinical Decision Making Low    Rehab Potential Good    PT Frequency 2x / week    PT Duration 6 weeks    PT Treatment/Interventions ADLs/Self Care Home Management;Cryotherapy;Electrical Stimulation;Moist Heat;Functional mobility training;Therapeutic activities;Therapeutic exercise;Balance training;Patient/family education;Neuromuscular re-education;Manual techniques;Scar mobilization;Taping;Dry needling;Passive range of motion;Vasopneumatic Device    PT Next Visit Plan vasopneumatic, STW/M/IASTM and stretching to patient's left knee, hamstring stretching, IT band stretching, LE strengthening to pt's tolerance    PT Home Exercise Plan glute sets, quad sets, IT band stretch (modified)    Consulted and Agree with Plan of Care Patient           Patient will benefit from skilled therapeutic intervention in order to improve the following deficits and impairments:  Pain, Decreased activity tolerance, Increased edema, Decreased range of motion  Visit Diagnosis: Acute pain of left knee  Localized edema     Problem List Patient Active Problem List   Diagnosis Date Noted  . Sinusitis, acute 11/29/2017  . Annual physical exam 11/27/2016  . 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 Psoriasis vulgaris 11/27/2016  . Mixed hyperlipidemia 11/27/2016  . High risk medication use 11/27/2016  . Allergic reaction 11/27/2016    Guss Bunde, PT, DPT 08/01/2020, 12:34 PM  [facility] [address] [place], [place], [address] Phone: [phone]   Fax:  [phone]  Name: Tricia Baker MRN: [id number] Date of Birth: 09/28/1951

## 2020-08-03 ENCOUNTER — Other Ambulatory Visit: Payer: Self-pay

## 2020-08-03 ENCOUNTER — Ambulatory Visit: Payer: Federal, State, Local not specified - PPO | Admitting: Physical Therapy

## 2020-08-03 ENCOUNTER — Encounter: Payer: Self-pay | Admitting: Physical Therapy

## 2020-08-03 DIAGNOSIS — M25562 Pain in left knee: Secondary | ICD-10-CM

## 2020-08-03 DIAGNOSIS — R600 Localized edema: Secondary | ICD-10-CM

## 2020-08-03 NOTE — Therapy (Signed)
[facility] [address] [place], [place], [address] Phone: [phone]   Fax:  [phone]  Physical Therapy Treatment  Patient Details  Name: Tricia Baker MRN: [id number] Date of Birth: 31-May-1951 Referring Provider (PT): Levonne Lapping MD   Encounter Date: 08/03/2020   PT End of Session - 08/03/20 0913    Visit Number 13    Number of Visits 16    Date for PT Re-Evaluation 08/26/20    PT Start Time 0900    PT Stop Time 0944    PT Time Calculation (min) 44 min    Activity Tolerance Patient tolerated treatment well    Behavior During Therapy [facility] for tasks assessed/performed           Past Medical History:  Diagnosis Date  . Arthritis   . Fluid retention   . Psoriasis   . Reflux     Past Surgical History:  Procedure Laterality Date  . ABDOMINAL HYSTERECTOMY    . APPENDECTOMY    . KNEE SURGERY    . SHOULDER SURGERY Right   . vocal cord surgery      There were no vitals filed for this visit.   Subjective Assessment - 08/03/20 0911    Subjective COVID-19 screen performed prior to patient entering clinic. Patient reports more swelling in the left lateral aspect of the knee    Pertinent History Sulfa drug allergy, no iontophoresis, OA, left TKA (2016), hip surgery in 2019, bee sting allergy.    Limitations Sitting;Standing;House hold activities    How long can you sit comfortably? Variable.    How long can you stand comfortably? Variable.    Patient Stated Goals Get rid of pain and swelling.    Currently in Pain? Yes    Pain Score 4     Pain Location Knee    Pain Orientation Left;Lateral    Pain Descriptors / Indicators Sore    Pain Type Acute pain    Pain Onset More than a month ago    Pain Frequency Constant              [facility] PT Assessment - 08/03/20 0001      Assessment   Medical Diagnosis Left knee pain.    Referring Provider (PT) Levonne Lapping MD      Precautions   Precautions Other  (comment)    Precaution Comments No ultrasound.  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                         [facility] Adult PT Treatment/Exercise - 08/03/20 0001      Exercises   Exercises Knee/Hip      Knee/Hip Exercises: Aerobic   Nustep L5 x 12 minutes; monitored for SPM      Vasopneumatic   Number Minutes Vasopneumatic  10 minutes    Vasopnuematic Location  Knee    Vasopneumatic Pressure Low    Vasopneumatic Temperature  34 for edema      Manual Therapy   Manual Therapy Soft tissue mobilization;Myofascial release    Myofascial Release IASTM/STM to distal IT band in hookying                       PT Long Term Goals - 07/25/20 1157      PT LONG TERM GOAL #1   Title Patient will be independent with HEP    Status Achieved      PT LONG  TERM GOAL #2   Title Perform ADL's with left knee pain not > 3/10.    Status Achieved      PT LONG TERM GOAL #3   Title Stand 20 minutes with left knee pain not > 3/10.    Status On-going      PT LONG TERM GOAL #4   Title Patient will report ability to perform ADLs with pain less than 3/10 in right hip    Status Achieved      PT LONG TERM GOAL #5   Title Patient will demonstrate reciprocating stair negotiation in order to enter and exit out of home safely.    Status Achieved                 Plan - 08/03/20 0945    Clinical Impression Statement Patient responded well to therapy session but reported feeling "slow" on the nustep today. Patient noted with more discomfort with IASTM but requested to continue with the same pressure as she responds better after the session. Patient and PT discussed cont HEP and reported she performs other exrcises for general wellness    Personal Factors and Comorbidities Comorbidity 1;Comorbidity 2;Comorbidity 3+    Comorbidities Sulfa drug allergy, no iontophoresis, OA, left TKA (2016), hip surgery in 2019, bee sting allergy.    Examination-Activity Limitations Locomotion  Level;Other    Examination-Participation Restrictions Other    Stability/Clinical Decision Making Evolving/Moderate complexity    Clinical Decision Making Low    PT Treatment/Interventions ADLs/Self Care Home Management;Cryotherapy;Electrical Stimulation;Moist Heat;Functional mobility training;Therapeutic activities;Therapeutic exercise;Balance training;Patient/family education;Neuromuscular re-education;Manual techniques;Scar mobilization;Taping;Dry needling;Passive range of motion;Vasopneumatic Device    PT Next Visit Plan vasopneumatic, STW/M/IASTM and stretching to patient's left knee, hamstring stretching, IT band stretching, LE strengthening to pt's tolerance    PT Home Exercise Plan glute sets, quad sets, IT band stretch (modified)    Consulted and Agree with Plan of Care Patient           Patient will benefit from skilled therapeutic intervention in order to improve the following deficits and impairments:  Pain, Decreased activity tolerance, Increased edema, Decreased range of motion  Visit Diagnosis: Acute pain of left knee  Localized edema     Problem List Patient Active Problem List   Diagnosis Date Noted  . Sinusitis, acute 11/29/2017  . Annual physical exam 11/27/2016  . 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 Psoriasis vulgaris 11/27/2016  . Mixed hyperlipidemia 11/27/2016  . High risk medication use 11/27/2016  . Allergic reaction 11/27/2016    Guss Bunde, PT, DPT 08/03/2020, 10:34 AM  [facility] [address] [place], [place], [address] Phone: [phone]   Fax:  [phone]  Name: VYLETTE STRUBEL MRN: [id number] Date of Birth: [birth year]

## 2020-08-08 ENCOUNTER — Other Ambulatory Visit: Payer: Self-pay

## 2020-08-08 ENCOUNTER — Encounter: Payer: Self-pay | Admitting: Physical Therapy

## 2020-08-08 ENCOUNTER — Ambulatory Visit: Payer: Federal, State, Local not specified - PPO | Admitting: Physical Therapy

## 2020-08-08 DIAGNOSIS — M25562 Pain in left knee: Secondary | ICD-10-CM | POA: Diagnosis not present

## 2020-08-08 DIAGNOSIS — R600 Localized edema: Secondary | ICD-10-CM

## 2020-08-08 NOTE — Therapy (Signed)
[facility] [address] [place], [place], [address] Phone: [phone]   Fax:  [phone]  Physical Therapy Treatment  Patient Details  Name: Tricia Baker MRN: [id number] Date of Birth: [birth year] Referring Provider (PT): Levonne Lapping MD   Encounter Date: 08/08/2020   PT End of Session - 08/08/20 1002    Visit Number 14    Number of Visits 16    Date for PT Re-Evaluation 08/26/20    PT Start Time 0949    PT Stop Time 1033    PT Time Calculation (min) 44 min    Activity Tolerance Patient tolerated treatment well    Behavior During Therapy [facility] for tasks assessed/performed           Past Medical History:  Diagnosis Date  . Arthritis   . Fluid retention   . Psoriasis   . Reflux     Past Surgical History:  Procedure Laterality Date  . ABDOMINAL HYSTERECTOMY    . APPENDECTOMY    . KNEE SURGERY    . SHOULDER SURGERY Right   . vocal cord surgery      There were no vitals filed for this visit.   Subjective Assessment - 08/08/20 1115    Subjective COVID-19 screen performed prior to patient entering clinic. Reports she was active yesterday and having some discomfort in distal ITB.    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    Limitations Sitting;Standing;House hold activities    How long can you sit comfortably? Variable.    How long can you stand comfortably? Variable.    Patient Stated Goals Get rid of pain and swelling.    Currently in Pain? Yes    Pain Score --    Pain Location Knee    Pain Orientation Left;Lateral    Pain Descriptors / Indicators Discomfort;Tightness    Pain Type Acute pain    Pain Onset More than a month ago    Pain Frequency Constant              [facility] PT Assessment - 08/08/20 0001      Assessment   Medical Diagnosis Left knee pain.    Referring Provider (PT) Levonne Lapping MD                          [facility] Adult PT Treatment/Exercise - 08/08/20 0001      Knee/Hip Exercises: Aerobic   Nustep L5 x 12 minutes; monitored for SPM      Modalities   Modalities Vasopneumatic      Vasopneumatic   Number Minutes Vasopneumatic  10 minutes    Vasopnuematic Location  Knee    Vasopneumatic Pressure Low    Vasopneumatic Temperature  34 for edema      Manual Therapy   Manual Therapy Soft tissue mobilization;Myofascial release    Myofascial Release IASTM/STM to distal ITB, quad to reduce tone and pain                       PT Long Term Goals - 07/25/20 1157      PT LONG TERM GOAL #1   Title Patient will be independent with HEP    Status Achieved      PT LONG TERM GOAL #2   Title Perform ADL's with left knee pain not > 3/10.    Status Achieved  PT LONG TERM GOAL #3   Title Stand 20 minutes with left knee pain not > 3/10.    Status On-going      PT LONG TERM GOAL #4   Title Patient will report ability to perform ADLs with pain less than 3/10 in right hip    Status Achieved      PT LONG TERM GOAL #5   Title Patient will demonstrate reciprocating stair negotiation in order to enter and exit out of home safely.    Status Achieved                 Plan - 08/08/20 1126    Clinical Impression Statement Patient presented in clinic with tightness of distal ITB after being active yesterday. Moderate redness response to distal ITB and quad but no complaints of tightness from patient during IASTW session. Min to moderate edema present along lateral L knee. Normal vasopneumatic response noted following removal of the modality.    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    Examination-Activity Limitations Locomotion Level;Other    Examination-Participation Restrictions Other     Stability/Clinical Decision Making Evolving/Moderate complexity    Rehab Potential Good    PT Frequency 2x / week    PT Duration 6 weeks    PT Treatment/Interventions ADLs/Self Care Home Management;Cryotherapy;Electrical Stimulation;Moist Heat;Functional mobility training;Therapeutic activities;Therapeutic exercise;Balance training;Patient/family education;Neuromuscular re-education;Manual techniques;Scar mobilization;Taping;Dry needling;Passive range of motion;Vasopneumatic Device    PT Next Visit Plan vasopneumatic, STW/M/IASTM and stretching to patient's left knee, hamstring stretching, IT band stretching, LE strengthening to pt's tolerance    PT Home Exercise Plan glute sets, quad sets, IT band stretch (modified)    Consulted and Agree with Plan of Care Patient           Patient will benefit from skilled therapeutic intervention in order to improve the following deficits and impairments:  Pain, Decreased activity tolerance, Increased edema, Decreased range of motion  Visit Diagnosis: Acute pain of left knee  Localized edema     Problem List Patient Active Problem List   Diagnosis Date Noted  . Sinusitis, acute 11/29/2017  . Annual physical exam 11/27/2016  . 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 Psoriasis vulgaris 11/27/2016  . Mixed hyperlipidemia 11/27/2016  . High risk medication use 11/27/2016  . Allergic reaction 11/27/2016    Marvell Fuller, PTA 08/08/2020, 11:30 AM  [facility] [address] [place], [place], [address] Phone: [phone]   Fax:  [phone]  Name: LACRESIA DARWISH MRN: [id number] Date of Birth: 06-24-1951

## 2020-08-10 ENCOUNTER — Encounter: Payer: Self-pay | Admitting: Physical Therapy

## 2020-08-10 ENCOUNTER — Other Ambulatory Visit: Payer: Self-pay

## 2020-08-10 ENCOUNTER — Ambulatory Visit: Payer: Federal, State, Local not specified - PPO | Admitting: Physical Therapy

## 2020-08-10 DIAGNOSIS — M25562 Pain in left knee: Secondary | ICD-10-CM | POA: Diagnosis not present

## 2020-08-10 DIAGNOSIS — R600 Localized edema: Secondary | ICD-10-CM

## 2020-08-10 NOTE — Therapy (Signed)
[facility] [address] [place], [place], [address] Phone: [phone]   Fax:  [phone]  Physical Therapy Treatment  Patient Details  Name: Tricia Baker MRN: [id number] Date of Birth: 11-30-1951 Referring Provider (PT): Levonne Lapping MD   Encounter Date: 08/10/2020   PT End of Session - 08/10/20 1025    Visit Number 15    Number of Visits 16    Date for PT Re-Evaluation 08/26/20    PT Start Time 0945    PT Stop Time 1030    PT Time Calculation (min) 45 min    Activity Tolerance Patient tolerated treatment well    Behavior During Therapy [facility] for tasks assessed/performed           Past Medical History:  Diagnosis Date  . Arthritis   . Fluid retention   . Psoriasis   . Reflux     Past Surgical History:  Procedure Laterality Date  . ABDOMINAL HYSTERECTOMY    . APPENDECTOMY    . KNEE SURGERY    . SHOULDER SURGERY Right   . vocal cord surgery      There were no vitals filed for this visit.   Subjective Assessment - 08/10/20 1018    Subjective COVID-19 screen performed prior to patient entering clinic. Pt reporting feeling better with the IASTM treatments. Pt still with swelling in L lateral knee.    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    Limitations Sitting;Standing;House hold activities    How long can you sit comfortably? Variable.    How long can you stand comfortably? Variable.    Patient Stated Goals Get rid of pain and swelling.    Currently in Pain? Yes    Pain Score 4     Pain Location Knee    Pain Orientation Left;Lateral    Pain Descriptors / Indicators Aching;Sore    Pain Type Acute pain    Pain Onset More than a month ago                             [facility] Adult PT Treatment/Exercise - 08/10/20 0001      Exercises   Exercises Knee/Hip      Knee/Hip Exercises: Aerobic   Nustep L5 x 12  minutes; monitored for SPM      Knee/Hip Exercises: Supine   Bridges Strengthening;15 reps    Straight Leg Raises Strengthening;Left;15 reps    Other Supine Knee/Hip Exercises ball squeezes x 15 holding 5 seconds      Modalities   Modalities Vasopneumatic      Vasopneumatic   Number Minutes Vasopneumatic  10 minutes    Vasopnuematic Location  Knee    Vasopneumatic Pressure Low    Vasopneumatic Temperature  34 for edema      Manual Therapy   Manual Therapy Soft tissue mobilization;Myofascial release    Manual therapy comments 15 minutes    Myofascial Release IASTM/STM to distal ITB, quad to reduce tone and pain                       PT Long Term Goals - 08/10/20 1028      PT LONG TERM GOAL #1   Title Patient will be independent with HEP    Status Achieved      PT LONG TERM GOAL #2   Title Perform  ADL's with left knee pain not > 3/10.    Status Achieved      PT LONG TERM GOAL #3   Title Stand 20 minutes with left knee pain not > 3/10.    Status On-going      PT LONG TERM GOAL #4   Title Patient will report ability to perform ADLs with pain less than 3/10 in right hip    Status Achieved      PT LONG TERM GOAL #5   Title Patient will demonstrate reciprocating stair negotiation in order to enter and exit out of home safely.    Status Achieved                 Plan - 08/10/20 1025    Clinical Impression Statement Pt presenting to clinic reporting 4/10 lateral left knee pain. Pt reporting feeling relief following IT band and lateral knee treatment. Pt still reporting tightness in left knee with flexion. We discussed possible extension, but pt feels next visit will be her last. Pt reporting she is planning on vacation next week. Conitnue PT for one last visit, with updated HEP.    Personal Factors and Comorbidities Comorbidity 1;Comorbidity 2;Comorbidity 3+    Comorbidities Sulfa drug allergy, no iontophoresis, OA, left TKA (2016), hip surgery in 2019, bee  sting allergy.    Examination-Activity Limitations Locomotion Level;Other    Stability/Clinical Decision Making Evolving/Moderate complexity    Rehab Potential Good    PT Frequency 2x / week    PT Duration 6 weeks    PT Treatment/Interventions ADLs/Self Care Home Management;Cryotherapy;Electrical Stimulation;Moist Heat;Functional mobility training;Therapeutic activities;Therapeutic exercise;Balance training;Patient/family education;Neuromuscular re-education;Manual techniques;Scar mobilization;Taping;Dry needling;Passive range of motion;Vasopneumatic Device    PT Next Visit Plan vasopneumatic, STW/M/IASTM and stretching to patient's left knee, hamstring stretching, IT band stretching, LE strengthening to pt's tolerance    PT Home Exercise Plan glute sets, quad sets, IT band stretch (modified)    Consulted and Agree with Plan of Care Patient           Patient will benefit from skilled therapeutic intervention in order to improve the following deficits and impairments:  Pain, Decreased activity tolerance, Increased edema, Decreased range of motion  Visit Diagnosis: Acute pain of left knee  Localized edema     Problem List Patient Active Problem List   Diagnosis Date Noted  . Sinusitis, acute 11/29/2017  . Annual physical exam 11/27/2016  . 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 Psoriasis vulgaris 11/27/2016  . Mixed hyperlipidemia 11/27/2016  . High risk medication use 11/27/2016  . Allergic reaction 11/27/2016    Sharmon Leyden, PT, MPT 08/10/2020, 10:33 AM  [facility] [address] [place], [place], [address] Phone: [phone]   Fax:  [phone]  Name: KAYTON RIPP MRN: [id number] Date of Birth: 08-08-1951

## 2020-08-15 ENCOUNTER — Other Ambulatory Visit: Payer: Self-pay

## 2020-08-15 ENCOUNTER — Ambulatory Visit: Payer: Federal, State, Local not specified - PPO | Admitting: Physical Therapy

## 2020-08-15 DIAGNOSIS — M25562 Pain in left knee: Secondary | ICD-10-CM | POA: Diagnosis not present

## 2020-08-15 DIAGNOSIS — R600 Localized edema: Secondary | ICD-10-CM

## 2020-08-15 NOTE — Therapy (Signed)
[facility], [place], [address] Phone: [phone]   Fax:  [phone]  Physical Therapy Treatment Discharge  Patient Details  Name: [place] Baker MRN: [id number] Date of Birth: 1951-08-05 Referring Provider (PT): Vela Prose MD   Encounter Date: 08/15/2020   PT End of Session - 08/15/20 0959    Visit Number 16    Number of Visits 16    Date for PT Re-Evaluation 08/26/20    PT Start Time 0945    PT Stop Time 1029    PT Time Calculation (min) 44 min    Activity Tolerance Patient tolerated treatment well    Behavior During Therapy [facility] for tasks assessed/performed           Past Medical History:  Diagnosis Date  . Arthritis   . Fluid retention   . Psoriasis   . Reflux     Past Surgical History:  Procedure Laterality Date  . ABDOMINAL HYSTERECTOMY    . APPENDECTOMY    . KNEE SURGERY    . SHOULDER SURGERY Right   . vocal cord surgery      There were no vitals filed for this visit.   Subjective Assessment - 08/15/20 0955    Subjective COVID-19 screen performed prior to patient entering clinic. Patient reported overall improvement just some ongoing swelling.    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    Limitations Sitting;Standing;House hold activities    How long can you sit comfortably? Variable.    How long can you stand comfortably? Variable.    Patient Stated Goals Get rid of pain and swelling.    Currently in Pain? Yes    Pain Score 3     Pain Location Knee    Pain Orientation Left    Pain Descriptors / Indicators Sore    Pain Type Acute pain    Pain Onset More than a month ago    Pain Frequency Constant    Aggravating Factors  increased activity    Pain Relieving Factors at rest                             [facility] Adult PT Treatment/Exercise - 08/15/20 0001      Knee/Hip Exercises:  Aerobic   Nustep L5 x 15 min      Vasopneumatic   Number Minutes Vasopneumatic  10 minutes    Vasopnuematic Location  Knee    Vasopneumatic Pressure Low    Vasopneumatic Temperature  34 for edema      Manual Therapy   Manual Therapy Soft tissue mobilization;Myofascial release    Myofascial Release IASTM/STM to distal ITB, quad to reduce tone and pain                       PT Long Term Goals - 08/15/20 7353      PT LONG TERM GOAL #1   Title Patient will be independent with HEP    Time 6    Period Weeks    Status Achieved      PT LONG TERM GOAL #2   Title Perform ADL's with left knee pain not > 3/10.    Time 6    Period Weeks    Status Achieved      PT LONG TERM GOAL #3   Title Stand 20 minutes with left  knee pain not > 3/10.    Baseline 3/10 reported and able to perform standing activity  more than 20 min 08/15/20    Time 6    Period Weeks    Status Achieved      PT LONG TERM GOAL #4   Title Patient will report ability to perform ADLs with pain less than 3/10 in right hip    Time 4    Period Weeks    Status Achieved      PT LONG TERM GOAL #5   Title Patient will demonstrate reciprocating stair negotiation in order to enter and exit out of home safely.    Time 4    Period Weeks    Status Achieved                 Plan - 08/15/20 1000    Clinical Impression Statement Patient has met all current goals and is ready to DC. Patient is independent with HEP. Patient is able to perfrom all standing, ADL's and activities with 3/10 discomfort or less.    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    Examination-Activity Limitations Locomotion Level;Other    Examination-Participation Restrictions Other    Stability/Clinical Decision Making Evolving/Moderate complexity    Rehab Potential Good    PT  Frequency 2x / week    PT Duration 6 weeks    PT Treatment/Interventions ADLs/Self Care Home Management;Cryotherapy;Electrical Stimulation;Moist Heat;Functional mobility training;Therapeutic activities;Therapeutic exercise;Balance training;Patient/family education;Neuromuscular re-education;Manual techniques;Scar mobilization;Taping;Dry needling;Passive range of motion;Vasopneumatic Device    PT Next Visit Plan DC    Consulted and Agree with Plan of Care Patient           Patient will benefit from skilled therapeutic intervention in order to improve the following deficits and impairments:  Pain, Decreased activity tolerance, Increased edema, Decreased range of motion  Visit Diagnosis: Acute pain of left knee  Localized edema  PHYSICAL THERAPY DISCHARGE SUMMARY  Visits from Start of Care: 16  Current functional level related to goals / functional outcomes: See above   Remaining deficits: All goals met   Education / Equipment: HEP  Plan: Patient agrees to discharge.  Patient goals were met. Patient is being discharged due to meeting the stated rehab goals.  ?????        Problem List Patient Active Problem List   Diagnosis Date Noted  . Sinusitis, acute 11/29/2017  . Annual physical exam 11/27/2016  . 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 Psoriasis vulgaris 11/27/2016  . Mixed hyperlipidemia 11/27/2016  . High risk medication use 11/27/2016  . Allergic reaction 11/27/2016   Kearney Hard, PT, MPT 08/17/20 12:12 PM    Ladean Raya, PTA 08/15/20 10:29 AM  [facility] [place], [place], [address] Phone: [phone]   Fax:  [phone]  Name: [place] Baker MRN: [id number] Date of Birth: July 02, 1951

## 2020-11-03 ENCOUNTER — Other Ambulatory Visit: Payer: Self-pay

## 2020-11-03 ENCOUNTER — Ambulatory Visit: Payer: Federal, State, Local not specified - PPO | Attending: Orthopaedic Surgery | Admitting: Physical Therapy

## 2020-11-03 DIAGNOSIS — R600 Localized edema: Secondary | ICD-10-CM | POA: Insufficient documentation

## 2020-11-03 DIAGNOSIS — M25562 Pain in left knee: Secondary | ICD-10-CM | POA: Diagnosis present

## 2020-11-03 DIAGNOSIS — M25512 Pain in left shoulder: Secondary | ICD-10-CM | POA: Insufficient documentation

## 2020-11-03 NOTE — Therapy (Signed)
[facility] [address] [place], [place], [address] Phone: [phone]   Fax:  [phone]  Physical Therapy Evaluation  Patient Details  Name: [place] Baker MRN: [id number] Date of Birth: [birth year] Referring Provider (PT): Floria Raveling MD   Encounter Date: 11/03/2020   PT End of Session - 11/03/20 1230    Visit Number 1    Number of Visits 6    Date for PT Re-Evaluation 12/15/20    PT Start Time 1115    PT Stop Time 1152    PT Time Calculation (min) 37 min    Activity Tolerance Patient tolerated treatment well    Behavior During Therapy [facility] for tasks assessed/performed           Past Medical History:  Diagnosis Date  . Arthritis   . Fluid retention   . Psoriasis   . Reflux     Past Surgical History:  Procedure Laterality Date  . ABDOMINAL HYSTERECTOMY    . APPENDECTOMY    . KNEE SURGERY    . SHOULDER SURGERY Right   . vocal cord surgery      There were no vitals filed for this visit.    Subjective Assessment - 11/03/20 1226    Subjective COVID-19 screen performed prior to patient entering clinic.  The patient reports lifting a heavy suitcase a few months ago and hurting her left shoulder.  A recent injection has been very helpful.  Her pain is a 4/10 today.  Rest and ice decreases pain.    Pertinent History Right shoulder surgery.    Currently in Pain? Yes    Pain Score 4     Pain Location Shoulder    Pain Orientation Left    Pain Descriptors / Indicators Aching    Pain Type Acute pain    Pain Onset More than a month ago    Pain Frequency Constant    Aggravating Factors  See above.              [facility] PT Assessment - 11/03/20 0001      Assessment   Medical Diagnosis Left shoulder pain.    Referring Provider (PT) Floria Raveling MD    Onset Date/Surgical Date --   ~3 months ago.     Precautions   Precautions None      Restrictions   Weight Bearing Restrictions No      Balance Screen    Has the patient fallen in the past 6 months No    Has the patient had a decrease in activity level because of a fear of falling?  No    Is the patient reluctant to leave their home because of a fear of falling?  No      Home Environment   Living Environment Private residence      Prior Function   Level of Independence Independent      Posture/Postural Control   Posture/Postural Control Postural limitations    Postural Limitations Rounded Shoulders;Forward head      ROM / Strength   AROM / PROM / Strength AROM;Strength      AROM   Overall AROM Comments Left active shoulder range of motion is essentially normal.      Strength   Overall Strength Comments Left shoulder strength is essentially normal.      Palpation   Palpation comment Tender over left UT/Supraspinatus with increased tone noted.  Objective measurements completed on examination: See above findings.       [facility] Adult PT Treatment/Exercise - 11/03/20 0001      Modalities   Modalities Electrical Stimulation;Moist Heat      Moist Heat Therapy   Number Minutes Moist Heat 15 Minutes    Moist Heat Location Shoulder      Electrical Stimulation   Electrical Stimulation Location Left shoulder.    Electrical Stimulation Action IFC    Electrical Stimulation Parameters 80-150 Hz x 15 minutes.    Electrical Stimulation Goals Pain                       PT Long Term Goals - 11/03/20 1244      PT LONG TERM GOAL #1   Title Patient will be independent with HEP    Time 3    Period Weeks    Status New      PT LONG TERM GOAL #2   Title Perform ADL's with left shoulder pain not > 3/10.    Time 3    Period Weeks    Status New                  Plan - 11/03/20 1240    Clinical Impression Statement The patient presents to OPPT with c/o left shoulder pain after lifting a heavy suitcase.  She has improved since receiving an injection.  Her CC is pain over her left  UT/Supraspinatus which is tender to palpation and demonstrates increased tone.  Her AROM and strength is essentially normal.  Patient will benefit from skilled physical therapy intervention to address deficits and pain.    Personal Factors and Comorbidities Comorbidity 1    Comorbidities Right shoulder surgery.    Examination-Activity Limitations Other    Examination-Participation Restrictions Other    Stability/Clinical Decision Making Stable/Uncomplicated    Clinical Decision Making Low    Rehab Potential Excellent    PT Frequency 2x / week    PT Duration 3 weeks    PT Treatment/Interventions ADLs/Self Care Home Management;Cryotherapy;Electrical Stimulation;Ultrasound;Moist Heat;Iontophoresis 4mg /ml Dexamethasone;Therapeutic activities;Therapeutic exercise;Manual techniques;Patient/family education    PT Next Visit Plan STW/M and combo e'stim/US to patient's left UT region.  RW4, sdly ER.    Consulted and Agree with Plan of Care Patient           Patient will benefit from skilled therapeutic intervention in order to improve the following deficits and impairments:  Pain, Increased muscle spasms, Decreased activity tolerance  Visit Diagnosis: Acute pain of left shoulder - Plan: PT plan of care cert/re-cert     Problem List Patient Active Problem List   Diagnosis Date Noted  . Sinusitis, acute 11/29/2017  . Annual physical exam 11/27/2016  . 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 Psoriasis vulgaris 11/27/2016  . Mixed hyperlipidemia 11/27/2016  . High risk medication use 11/27/2016  . Allergic reaction 11/27/2016    Michaelanthony Kempton, 14/11/2016 MPT 11/03/2020, 12:46 PM  [facility] [address] [place], [place], [place] Phone: [phone]   Fax:  [phone]  Name: [place] Baker MRN: Wynonia Lawman Date of Birth: 01-13-1951

## 2020-11-08 ENCOUNTER — Ambulatory Visit: Payer: Federal, State, Local not specified - PPO | Admitting: Physical Therapy

## 2020-11-08 ENCOUNTER — Other Ambulatory Visit: Payer: Self-pay

## 2020-11-08 DIAGNOSIS — M25562 Pain in left knee: Secondary | ICD-10-CM

## 2020-11-08 DIAGNOSIS — M25512 Pain in left shoulder: Secondary | ICD-10-CM | POA: Diagnosis not present

## 2020-11-08 DIAGNOSIS — R600 Localized edema: Secondary | ICD-10-CM

## 2020-11-08 NOTE — Therapy (Signed)
[facility] [address] [place], [place], [address] Phone: [phone]   Fax:  [phone]  Physical Therapy Treatment  Patient Details  Name: [place] Baker MRN: [id number] Date of Birth: 1951/10/12 Referring Provider (PT): Floria Raveling MD   Encounter Date: 11/08/2020   PT End of Session - 11/08/20 1158    Visit Number 2    Number of Visits 6    Date for PT Re-Evaluation 12/15/20    PT Start Time 1115    PT Stop Time 1200    PT Time Calculation (min) 45 min    Activity Tolerance Patient tolerated treatment well    Behavior During Therapy [facility] for tasks assessed/performed           Past Medical History:  Diagnosis Date   Arthritis    Fluid retention    Psoriasis    Reflux     Past Surgical History:  Procedure Laterality Date   ABDOMINAL HYSTERECTOMY     APPENDECTOMY     KNEE SURGERY     SHOULDER SURGERY Right    vocal cord surgery      There were no vitals filed for this visit.   Subjective Assessment - 11/08/20 1153    Subjective COVID-19 screen performed prior to patient entering clinic.  Pain about a 4 rigth now.  Lifted some Christmas boxes.    Pertinent History Right shoulder surgery.    Currently in Pain? Yes    Pain Score 4     Pain Location Shoulder    Pain Orientation Left    Pain Descriptors / Indicators Aching    Pain Onset More than a month ago                             [facility] Adult PT Treatment/Exercise - 11/08/20 0001      Modalities   Modalities Electrical Stimulation;Moist Heat;Ultrasound      Moist Heat Therapy   Number Minutes Moist Heat 15 Minutes    Moist Heat Location --   Left shoulder.     Emergency planning/management officer LT UT    Electrical Stimulation Action IFC    Electrical Stimulation Parameters 80-150 Hz at 100% scan x 15 minutes.    Electrical Stimulation Goals Tone;Pain      Ultrasound   Ultrasound Location Left  UT    Ultrasound Parameters Combo e'stim/US at 1.50 W/CM2 x 12 minutes.      Manual Therapy   Manual Therapy Soft tissue mobilization    Soft tissue mobilization STW/M inluding ischemic release technique to patient's left UT x 11 minutes.                       PT Long Term Goals - 11/03/20 1244      PT LONG TERM GOAL #1   Title Patient will be independent with HEP    Time 3    Period Weeks    Status New      PT LONG TERM GOAL #2   Title Perform ADL's with left shoulder pain not > 3/10.    Time 3    Period Weeks    Status New                 Plan - 11/08/20 1157    Clinical Impression Statement Patient did well with treatment today though tone remains in her left UT.  Personal Factors and Comorbidities Comorbidity 1    Comorbidities Right shoulder surgery.    Examination-Activity Limitations Other    Examination-Participation Restrictions Other    Stability/Clinical Decision Making Stable/Uncomplicated    Rehab Potential Excellent    PT Frequency 2x / week    PT Duration 3 weeks    PT Treatment/Interventions ADLs/Self Care Home Management;Cryotherapy;Electrical Stimulation;Ultrasound;Moist Heat;Iontophoresis 4mg /ml Dexamethasone;Therapeutic activities;Therapeutic exercise;Manual techniques;Patient/family education    PT Next Visit Plan STW/M and combo e'stim/US to patient's left UT region.  RW4, sdly ER.    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

## 2020-11-15 ENCOUNTER — Other Ambulatory Visit: Payer: Self-pay

## 2020-11-15 ENCOUNTER — Ambulatory Visit: Payer: Federal, State, Local not specified - PPO | Admitting: *Deleted

## 2020-11-15 DIAGNOSIS — M25512 Pain in left shoulder: Secondary | ICD-10-CM

## 2020-11-15 DIAGNOSIS — R600 Localized edema: Secondary | ICD-10-CM

## 2020-11-15 DIAGNOSIS — M25562 Pain in left knee: Secondary | ICD-10-CM

## 2020-11-15 NOTE — Therapy (Signed)
[facility] [address] [place], [place], [address] Phone: [phone]   Fax:  [phone]  Physical Therapy Treatment  Patient Details  Name: T[place] Baker MRN: [id number] Date of Birth: 04/07/1951 Referring Provider (PT): Floria Raveling MD   Encounter Date: 11/15/2020   PT End of Session - 11/15/20 1424    Visit Number 3    Number of Visits 6    Date for PT Re-Evaluation 12/15/20    PT Start Time 1345    PT Stop Time 1435    PT Time Calculation (min) 50 min           Past Medical History:  Diagnosis Date  . Arthritis   . Fluid retention   . Psoriasis   . Reflux     Past Surgical History:  Procedure Laterality Date  . ABDOMINAL HYSTERECTOMY    . APPENDECTOMY    . KNEE SURGERY    . SHOULDER SURGERY Right   . vocal cord surgery      There were no vitals filed for this visit.   Subjective Assessment - 11/15/20 1345    Subjective COVID-19 screen performed prior to patient entering clinic.  Pain about a 6/10    Pertinent History Right shoulder surgery.    Currently in Pain? Yes    Pain Score 6     Pain Location Shoulder    Pain Orientation Left    Pain Descriptors / Indicators Aching    Pain Type Acute pain                             [facility] Adult PT Treatment/Exercise - 11/15/20 0001      Modalities   Modalities Electrical Stimulation;Moist Heat;Ultrasound      Moist Heat Therapy   Number Minutes Moist Heat 15 Minutes    Moist Heat Location Shoulder   Left shoulder.     Emergency planning/management officer LT UT    Electrical Stimulation Action IFC    Electrical Stimulation Parameters 80-150 hz x 15 mins    Electrical Stimulation Goals Tone;Pain      Ultrasound   Ultrasound Location LT UT    Ultrasound Parameters combo estim/ 1.5 w/cm2 x10 mins    Ultrasound Goals Pain      Manual Therapy   Manual Therapy Soft tissue mobilization    Soft tissue  mobilization STW/M inluding ischemic release technique to patient's left UT                        PT Long Term Goals - 11/03/20 1244      PT LONG TERM GOAL #1   Title Patient will be independent with HEP    Time 3    Period Weeks    Status New      PT LONG TERM GOAL #2   Title Perform ADL's with left shoulder pain not > 3/10.    Time 3    Period Weeks    Status New                 Plan - 11/15/20 1819    Clinical Impression Statement Pt arrived today doing fair, butr still with tightness/pain on LT side UT/ levator scap. Rx focused on STW , [place] combo as well as estim/MH to decrease TPs and tone LT side. Good response with decreased pain and TPR LT UT.  Comorbidities Right shoulder surgery.    Rehab Potential Excellent    PT Frequency 2x / week    PT Duration 3 weeks    PT Treatment/Interventions ADLs/Self Care Home Management;Cryotherapy;Electrical Stimulation;Ultrasound;Moist Heat;Iontophoresis 4mg /ml Dexamethasone;Therapeutic activities;Therapeutic exercise;Manual techniques;Patient/family education    PT Next Visit Plan STW/M and combo e'stim/US to patient's left UT region.  RW4, sdly ER.    Consulted and Agree with Plan of Care Patient           Patient will benefit from skilled therapeutic intervention in order to improve the following deficits and impairments:  Pain, Increased muscle spasms, Decreased activity tolerance  Visit Diagnosis: Acute pain of left shoulder  Acute pain of left knee  Localized edema     Problem List Patient Active Problem List   Diagnosis Date Noted  . Sinusitis, acute 11/29/2017  . Annual physical exam 11/27/2016  . 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 Psoriasis vulgaris 11/27/2016  . Mixed hyperlipidemia 11/27/2016  . High risk medication use 11/27/2016  . Allergic reaction 11/27/2016    Damian Hofstra,CHRIS , PTA 11/15/2020, 6:23 PM  [facility] [address] [place], [place], [place] Phone: [phone]   Fax:  [phone]  Name: [place] Baker MRN: Wynonia Lawman Date of Birth: 1951-08-11

## 2020-11-17 ENCOUNTER — Ambulatory Visit: Payer: Federal, State, Local not specified - PPO | Attending: Orthopaedic Surgery | Admitting: Physical Therapy

## 2020-11-17 ENCOUNTER — Other Ambulatory Visit: Payer: Self-pay

## 2020-11-17 DIAGNOSIS — M25512 Pain in left shoulder: Secondary | ICD-10-CM | POA: Diagnosis not present

## 2020-11-17 NOTE — Patient Instructions (Signed)
Strengthening: Resisted Flexion   Hold tubing with right/left arm at side. Pull forward and up. Move shoulder through pain-free range of motion. Repeat __10__ times per set. Do _2-3___ sets per session. Do _2-3___ sessions per day.  Strengthening: Resisted Extension   Hold tubing in right/left hand, arm forward. Pull arm back, elbow straight. Repeat __10__ times per set. Do _2-3___ sets per session. Do _2-3___ sessions per day.    Strengthening: Resisted Internal Rotation   Hold tubing in right/left hand, elbow at side and forearm out. Rotate forearm in across body. Repeat __10__ times per set. Do _2-3___ sets per session. Do _2-3___ sessions per day.   Strengthening: Resisted External Rotation   Hold tubing in right/left hand, elbow at side and forearm across body. Rotate forearm out. Repeat __10__ times per set. Do __2-3__ sets per session. Do ____ sessions per day.

## 2020-11-17 NOTE — Therapy (Signed)
[facility] [place], [place], [address] Phone: [phone]   Fax:  [phone]  Physical Therapy Treatment  Patient Details  Name: Tricia Baker MRN: [id number] Date of Birth: Jul 29, 1951 Referring Provider (PT): Douglass Rivers MD   Encounter Date: 11/17/2020   PT End of Session - 11/17/20 1103    Visit Number 4    Number of Visits 6    Date for PT Re-Evaluation 12/15/20    PT Start Time 1030    PT Stop Time 1114    PT Time Calculation (min) 44 min    Activity Tolerance Patient tolerated treatment well    Behavior During Therapy [facility] for tasks assessed/performed           Past Medical History:  Diagnosis Date  . Arthritis   . Fluid retention   . Psoriasis   . Reflux     Past Surgical History:  Procedure Laterality Date  . ABDOMINAL HYSTERECTOMY    . APPENDECTOMY    . KNEE SURGERY    . SHOULDER SURGERY Right   . vocal cord surgery      There were no vitals filed for this visit.   Subjective Assessment - 11/17/20 1028    Subjective COVID-19 screen performed prior to patient entering clinic.  Patient reported some improvement today.    Pertinent History Right shoulder surgery.    Currently in Pain? Yes    Pain Score 3     Pain Location Shoulder    Pain Orientation Left    Pain Type Acute pain    Pain Onset More than a month ago    Pain Frequency Constant    Aggravating Factors  lifting and prolong sitting position    Pain Relieving Factors rest                             [facility] Adult PT Treatment/Exercise - 11/17/20 0001      Exercises   Exercises Shoulder      Shoulder Exercises: Seated   Retraction Strengthening;Left;20 reps;Theraband    Theraband Level (Shoulder Retraction) Level 1 (Yellow)    Protraction Strengthening;Left;20 reps;Theraband    Theraband Level (Shoulder Protraction) Level 1 (Yellow)    External Rotation Strengthening;Left;20 reps;Theraband    Theraband  Level (Shoulder External Rotation) Level 1 (Yellow)    Internal Rotation Strengthening;Left;20 reps;Theraband    Theraband Level (Shoulder Internal Rotation) Level 1 (Yellow)      Moist Heat Therapy   Number Minutes Moist Heat 15 Minutes    Moist Heat Location Shoulder      Electrical Stimulation   Electrical Stimulation Location LT UT    Electrical Stimulation Action IFC    Electrical Stimulation Parameters 80-150hz  x68min    Electrical Stimulation Goals Tone;Pain      Ultrasound   Ultrasound Location LT UT    Ultrasound Parameters [place] @ 1.5w/cm2/50%/3mhz x66min    Ultrasound Goals Pain      Manual Therapy   Manual Therapy Soft tissue mobilization    Soft tissue mobilization manual STW to left UT to reduce pain and tone                       PT Long Term Goals - 11/17/20 1027      PT LONG TERM GOAL #1   Title Patient will be independent with HEP    Baseline issued today 11/17/20    Time  3    Period Weeks    Status Partially Met      PT LONG TERM GOAL #2   Title Perform ADL's with left shoulder pain not > 3/10.    Baseline limited with ADL's due to pain 11/17/20    Time 3    Period Weeks    Status On-going                 Plan - 11/17/20 1105    Clinical Impression Statement Patient tolerated treatment well today. Today educated patient on RW4 exercise with HEP provided. Patient feels overall improvement yet ongoing pain over 3/10 with prolong movement or activity of left shoulder. Patient progressing toward goals.    Personal Factors and Comorbidities Comorbidity 1    Comorbidities Right shoulder surgery.    Examination-Activity Limitations Other    Examination-Participation Restrictions Other    Stability/Clinical Decision Making Stable/Uncomplicated    Rehab Potential Excellent    PT Frequency 2x / week    PT Duration 3 weeks    PT Treatment/Interventions ADLs/Self Care Home Management;Cryotherapy;Electrical Stimulation;Ultrasound;Moist  Heat;Iontophoresis 4mg /ml Dexamethasone;Therapeutic activities;Therapeutic exercise;Manual techniques;Patient/family education    PT Next Visit Plan cont with POC for STW/M and combo e'stim/US to patient's left UT region.  review RW4, sdly ER.    Consulted and Agree with Plan of Care Patient           Patient will benefit from skilled therapeutic intervention in order to improve the following deficits and impairments:  Pain, Increased muscle spasms, Decreased activity tolerance  Visit Diagnosis: Acute pain of left shoulder     Problem List Patient Active Problem List   Diagnosis Date Noted  . Sinusitis, acute 11/29/2017  . Annual physical exam 11/27/2016  . 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 Psoriasis vulgaris 11/27/2016  . Mixed hyperlipidemia 11/27/2016  . High risk medication use 11/27/2016  . Allergic reaction 11/27/2016    Alwin Lanigan P, PTA 11/17/2020, 11:20 AM  [facility] [place], [place], [address] Phone: [phone]   Fax:  [phone]  Name: CAIDEN MONSIVAIS MRN: [id number] Date of Birth: 31-Aug-1951

## 2020-11-21 ENCOUNTER — Ambulatory Visit: Payer: Federal, State, Local not specified - PPO | Admitting: Physical Therapy

## 2020-11-23 ENCOUNTER — Encounter: Payer: Federal, State, Local not specified - PPO | Admitting: Physical Therapy

## 2020-11-29 ENCOUNTER — Ambulatory Visit: Payer: Federal, State, Local not specified - PPO | Admitting: Physical Therapy

## 2021-10-16 ENCOUNTER — Other Ambulatory Visit: Payer: Self-pay

## 2021-10-16 ENCOUNTER — Ambulatory Visit: Payer: Federal, State, Local not specified - PPO | Attending: Rheumatology | Admitting: Physical Therapy

## 2021-10-16 DIAGNOSIS — G8929 Other chronic pain: Secondary | ICD-10-CM | POA: Insufficient documentation

## 2021-10-16 DIAGNOSIS — M25512 Pain in left shoulder: Secondary | ICD-10-CM | POA: Insufficient documentation

## 2021-10-16 NOTE — Therapy (Signed)
[facility] [address] [place], [place], [address] Phone: [phone]   Fax:  [phone]  Physical Therapy Evaluation  Patient Details  Name: Tricia Baker MRN: [id number] Date of Birth: 07-15-1951 Referring Provider (PT): Floria Raveling MD.   Encounter Date: 10/16/2021   PT End of Session - 10/16/21 1215     Visit Number 1    Number of Visits 4    Date for PT Re-Evaluation 11/06/21    PT Start Time 1115    PT Stop Time 1145    PT Time Calculation (min) 30 min    Activity Tolerance Patient tolerated treatment well    Behavior During Therapy [facility] for tasks assessed/performed             Past Medical History:  Diagnosis Date   Arthritis    Fluid retention    Psoriasis    Reflux     Past Surgical History:  Procedure Laterality Date   ABDOMINAL HYSTERECTOMY     APPENDECTOMY     KNEE SURGERY     SHOULDER SURGERY Right    vocal cord surgery      There were no vitals filed for this visit.    Subjective Assessment - 10/16/21 1216     Subjective COVID-19 screen performed prior to patient entering clinic.  The patient presents to the clinic today with c/o intermittment left shoulder pain.  She states she would like to have a few sessions to establish a HEP.  Today, at rest she reports no pain but states that with increased activity such as yardwork (ie:  weedeating) and lifting grandchildren can cause pain to rise to a 7-8/10.  Medications help decrease her pain but she wants to manage her pain without taking them.    Pertinent History Right shoulder surgery, knee surgery, OA.    Patient Stated Goals Perform ADL's with less pain and manage pain through exercise.    Currently in Pain? No/denies                [facility] PT Assessment - 10/16/21 0001       Assessment   Medical Diagnosis Left shoulder pain.    Referring Provider (PT) Floria Raveling MD.    Onset Date/Surgical Date --   More than a year ago.      Precautions   Precautions None      Restrictions   Weight Bearing Restrictions No      Balance Screen   Has the patient fallen in the past 6 months No    Has the patient had a decrease in activity level because of a fear of falling?  No    Is the patient reluctant to leave their home because of a fear of falling?  No      Home Environment   Living Environment Private residence      Prior Function   Level of Independence Independent      Posture/Postural Control   Posture/Postural Control Postural limitations    Postural Limitations Rounded Shoulders;Forward head      Deep Tendon Reflexes   DTR Assessment Site Biceps;Brachioradialis;Triceps    Biceps DTR 1+    Brachioradialis DTR 2+    Triceps DTR 2+      AROM   Overall AROM Comments Bilateral active cervical rotation is 60 degrees.  Normal left shoulder range with excellent behind the back movement.      Strength   Overall Strength Comments Left shoulder strength is normal.  Palpation   Palpation comment Tender over left Ut/Supraspinatus with a significant amount of tone.                        Objective measurements completed on examination: See above findings.                PT Education - 10/16/21 1220     Education Details We discussed at length the importance of correct posture.  Demonstrated chin tucks and cervical extension, using her TENS unit and obtaining a theracane to deactivate trigger points/muscle spams.                 PT Long Term Goals - 10/16/21 1231       PT LONG TERM GOAL #1   Title Patient will be independent with HEP    Time 3    Period Weeks    Status New                    Plan - 10/16/21 1225     Clinical Impression Statement The patient presenst to [facility] with c/o intermittment left shoulder pain for over a year.  Her pain increase with activties such as yardwork.  She is found to have a forward head and rounded shoulder posture.  She  has a notable increase in tone in left UT/Supraspintus region and she states this is the area of pain thta is most problematic.  She wants to have a few visits to have a comprehensive home management program developed.  Her left shoulder range of motion and strength is normal.    Personal Factors and Comorbidities Comorbidity 1    Comorbidities Right shoulder surgery, OA.    Examination-Activity Limitations Other;Lift    Examination-Participation Restrictions Other;Yard Work    Stability/Clinical Decision Making Stable/Uncomplicated    Rehab Potential Good    PT Frequency --   4 visits.   PT Treatment/Interventions ADLs/Self Care Home Management;Therapeutic activities;Therapeutic exercise;Manual techniques;Patient/family education    PT Next Visit Plan Review chin tucks and extension, left shoulder RW, scapular exercises, corner stretch, please show her how to use a theracane to reduce left UT, she also plans to bring in her TENS unit (she benefit from a Modulated protocol to reduce tone and pain in left UT.    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 [place], [place], [address] Phone: [phone]   Fax:  [phone]  Name: Tricia Baker MRN: [id number] Date of Birth:  1950/12/26

## 2021-10-19 ENCOUNTER — Other Ambulatory Visit: Payer: Self-pay

## 2021-10-19 ENCOUNTER — Ambulatory Visit: Payer: Federal, State, Local not specified - PPO | Attending: Rheumatology | Admitting: *Deleted

## 2021-10-19 DIAGNOSIS — G8929 Other chronic pain: Secondary | ICD-10-CM | POA: Insufficient documentation

## 2021-10-19 DIAGNOSIS — M25512 Pain in left shoulder: Secondary | ICD-10-CM | POA: Insufficient documentation

## 2021-10-19 NOTE — Therapy (Signed)
[facility] [address] [place], [place], [address] Phone: [phone]   Fax:  [phone]  Physical Therapy Treatment  Patient Details  Name: Tricia Baker MRN: [id number] Date of Birth: 01-25-1951 Referring Provider (PT): Floria Raveling MD.   Encounter Date: 10/19/2021   PT End of Session - 10/19/21 1040     Visit Number 2    Number of Visits 4    Date for PT Re-Evaluation 11/06/21    PT Start Time 1030    PT Stop Time 1116    PT Time Calculation (min) 46 min    Activity Tolerance Patient tolerated treatment well    Behavior During Therapy [facility] for tasks assessed/performed             Past Medical History:  Diagnosis Date   Arthritis    Fluid retention    Psoriasis    Reflux     Past Surgical History:  Procedure Laterality Date   ABDOMINAL HYSTERECTOMY     APPENDECTOMY     KNEE SURGERY     SHOULDER SURGERY Right    vocal cord surgery      There were no vitals filed for this visit.   Subjective Assessment - 10/19/21 1031     Subjective COVID-19 screen performed prior to patient entering clinic.  The patient presents to the clinic today with c/o intermittment left shoulder pain.  She states she would like to have a few sessions to establish a HEP.  Today, at rest she reports no pain but states that with increased activity such as yardwork (ie:  weedeating) and lifting grandchildren can cause pain to rise to a 7-8/10.  Medications help decrease her pain but she wants to manage her pain without taking them.    Pertinent History Right shoulder surgery, knee surgery, OA.    Patient Stated Goals Perform ADL's with less pain and manage pain through exercise.    Currently in Pain? Yes    Pain Score 3     Pain Location Shoulder    Pain Orientation Left    Pain Descriptors / Indicators Aching    Pain Type Acute pain    Pain Onset More than a month ago                               [facility] Adult  PT Treatment/Exercise - 10/19/21 0001       Self-Care   Self-Care Other Self-Care Comments    Other Self-Care Comments  Instructed Pt in Home TENS use and set-up,  Theracane use for TPR to LT UT      Exercises   Exercises Neck;Shoulder      Neck Exercises: Seated   Neck Retraction 10 reps   neck extension x 10     Shoulder Exercises: Standing   Other Standing Exercises RW 5 yellow tbandx15 -20 band and HO given                          PT Long Term Goals - 10/16/21 1231       PT LONG TERM GOAL #1   Title Patient will be independent with HEP    Time 3    Period Weeks    Status New                   Plan - 10/19/21 1032     Clinical  Impression Statement Pt arrived today doing fairly well with minimal pain at the moment, but reports it increases with ADL's.  Pt was instructed in Cervical retractions and extension and did well without complaints. She was also able to perform RW 4 exs as well as shldr extension with yellow tband without increased pain. Yellow tband and HO was given for HEP. Pt brought in home TENS unit and instructed in modulation mode and performed on LT UT.    Personal Factors and Comorbidities Comorbidity 1    Comorbidities Right shoulder surgery, OA.    Examination-Activity Limitations Other;Lift    Examination-Participation Restrictions Other;Yard Work    Stability/Clinical Decision Making Stable/Uncomplicated    PT Duration 3 weeks    PT Treatment/Interventions ADLs/Self Care Home Management;Therapeutic activities;Therapeutic exercise;Manual techniques;Patient/family education    PT Next Visit Plan Review chin tucks and extension, left shoulder RW, scapular exercises, corner stretch, please show her how to use a theracane to reduce left UT, she also plans to bring in her TENS unit (she benefit from a Modulated protocol to reduce tone and pain in left UT.    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

## 2021-10-23 ENCOUNTER — Ambulatory Visit: Payer: Federal, State, Local not specified - PPO

## 2021-10-23 ENCOUNTER — Other Ambulatory Visit: Payer: Self-pay

## 2021-10-23 DIAGNOSIS — G8929 Other chronic pain: Secondary | ICD-10-CM

## 2021-10-23 DIAGNOSIS — M25512 Pain in left shoulder: Secondary | ICD-10-CM | POA: Diagnosis not present

## 2021-10-23 NOTE — Therapy (Signed)
[facility] [address] [place], [place], [address] Phone: [phone]   Fax:  [phone]  Physical Therapy Treatment  Patient Details  Name: Tricia Baker MRN: [id number] Date of Birth: Mar 05, 1951 Referring Provider (PT): Floria Raveling MD.   Encounter Date: 10/23/2021   PT End of Session - 10/23/21 1121     Visit Number 3    Number of Visits 4    Date for PT Re-Evaluation 11/06/21    PT Start Time 1115    PT Stop Time 1157    PT Time Calculation (min) 42 min    Activity Tolerance Patient tolerated treatment well    Behavior During Therapy [facility] for tasks assessed/performed             Past Medical History:  Diagnosis Date   Arthritis    Fluid retention    Psoriasis    Reflux     Past Surgical History:  Procedure Laterality Date   ABDOMINAL HYSTERECTOMY     APPENDECTOMY     KNEE SURGERY     SHOULDER SURGERY Right    vocal cord surgery      There were no vitals filed for this visit.   Subjective Assessment - 10/23/21 1117     Subjective COVID-19 screen performed prior to patient entering clinic.  Pt denies any pain upon arriving for today's treatment session, but was a bad day with pain getting up to a 5/10.    Pertinent History Right shoulder surgery, knee surgery, OA.    Patient Stated Goals Perform ADL's with less pain and manage pain through exercise.    Currently in Pain? No/denies    Pain Onset More than a month ago                               [facility] Adult PT Treatment/Exercise - 10/23/21 0001       Exercises   Exercises Neck;Shoulder      Neck Exercises: Machines for Strengthening   UBE (Upper Arm Bike) 6 mins   3 mins forward/3 mins backward     Neck Exercises: Theraband   Scapula Retraction 10 reps   with 3 sec hold   Shoulder Extension 15 reps   Yellow   Rows 15 reps   Yello   Shoulder ADduction 15 reps   Yellow   Shoulder External Rotation 15 reps   Yellow   Other  Theraband Exercises Shoulder Shrugs x 10 reps      Neck Exercises: Seated   Neck Retraction 10 reps   neck extensions x 10   Lateral Flexion Both;10 reps      Neck Exercises: Stretches   Corner Stretch 5 reps;10 seconds   Cues for hold                         PT Long Term Goals - 10/16/21 1231       PT LONG TERM GOAL #1   Title Patient will be independent with HEP    Time 3    Period Weeks    Status New                   Plan - 10/23/21 1121     Clinical Impression Statement Pt arrived for treatment session today denying any pain.  Reviewed HEP with minimal cues for proper technique.  Pt instructed in corner stretch and cervical  lateral flexion with over-pressure.  Pt able to perform all RW 4 exs with yellow band without increase in pain.  Pt stated that she has an MRI next Tuesday and would like to save her last appointment for after that being read just in case muscular damage or irregularities are found.  Pt denies any pain at completion of today's treatment session.    Personal Factors and Comorbidities Comorbidity 1    Comorbidities Right shoulder surgery, OA.    Examination-Activity Limitations Other;Lift    Examination-Participation Restrictions Other;Yard Work    Stability/Clinical Decision Making Stable/Uncomplicated    PT Duration 3 weeks    PT Treatment/Interventions ADLs/Self Care Home Management;Therapeutic activities;Therapeutic exercise;Manual techniques;Patient/family education    PT Next Visit Plan Review chin tucks and extension, left shoulder RW, scapular exercises, corner stretch, please show her how to use a theracane to reduce left UT, she also plans to bring in her TENS unit (she benefit from a Modulated protocol to reduce tone and pain in left UT.    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 [place], [place], [address] Phone: [phone]   Fax:  [phone]  Name: Tricia Baker MRN: [id number] Date of Birth: 03-12-1951
